# Patient Record
Sex: Female | Born: 1980 | Race: Black or African American | Hispanic: No | Marital: Single | Smoking: Current every day smoker
Health system: Southern US, Community
[De-identification: ages and names within clinical notes are randomized; demographics above are authoritative.]

## PROBLEM LIST (undated history)

## (undated) DIAGNOSIS — D649 Anemia, unspecified: Secondary | ICD-10-CM

---

## 1998-02-21 ENCOUNTER — Emergency Department (HOSPITAL_COMMUNITY): Admission: EM | Admit: 1998-02-21 | Discharge: 1998-02-21 | Payer: Self-pay | Admitting: Emergency Medicine

## 1998-03-02 ENCOUNTER — Encounter: Admission: RE | Admit: 1998-03-02 | Discharge: 1998-03-02 | Payer: Self-pay | Admitting: Family Medicine

## 1998-03-06 ENCOUNTER — Encounter: Admission: RE | Admit: 1998-03-06 | Discharge: 1998-03-06 | Payer: Self-pay | Admitting: Family Medicine

## 1998-03-13 ENCOUNTER — Encounter: Admission: RE | Admit: 1998-03-13 | Discharge: 1998-03-13 | Payer: Self-pay | Admitting: Family Medicine

## 1998-06-18 ENCOUNTER — Encounter: Admission: RE | Admit: 1998-06-18 | Discharge: 1998-06-18 | Payer: Self-pay | Admitting: Family Medicine

## 1998-08-29 ENCOUNTER — Encounter: Admission: RE | Admit: 1998-08-29 | Discharge: 1998-08-29 | Payer: Self-pay | Admitting: Family Medicine

## 1998-12-15 ENCOUNTER — Emergency Department (HOSPITAL_COMMUNITY): Admission: EM | Admit: 1998-12-15 | Discharge: 1998-12-15 | Payer: Self-pay | Admitting: Emergency Medicine

## 1999-02-07 ENCOUNTER — Encounter: Admission: RE | Admit: 1999-02-07 | Discharge: 1999-02-07 | Payer: Self-pay | Admitting: Family Medicine

## 2000-04-04 ENCOUNTER — Emergency Department (HOSPITAL_COMMUNITY): Admission: EM | Admit: 2000-04-04 | Discharge: 2000-04-05 | Payer: Self-pay | Admitting: Emergency Medicine

## 2000-07-28 ENCOUNTER — Encounter: Admission: RE | Admit: 2000-07-28 | Discharge: 2000-07-28 | Payer: Self-pay | Admitting: Family Medicine

## 2000-08-13 ENCOUNTER — Encounter: Admission: RE | Admit: 2000-08-13 | Discharge: 2000-08-13 | Payer: Self-pay | Admitting: Family Medicine

## 2000-10-14 ENCOUNTER — Encounter: Admission: RE | Admit: 2000-10-14 | Discharge: 2000-10-14 | Payer: Self-pay | Admitting: Family Medicine

## 2001-06-29 ENCOUNTER — Inpatient Hospital Stay (HOSPITAL_COMMUNITY): Admission: EM | Admit: 2001-06-29 | Discharge: 2001-06-30 | Payer: Self-pay | Admitting: Emergency Medicine

## 2001-06-29 ENCOUNTER — Encounter: Payer: Self-pay | Admitting: Emergency Medicine

## 2001-07-08 ENCOUNTER — Encounter: Admission: RE | Admit: 2001-07-08 | Discharge: 2001-07-08 | Payer: Self-pay | Admitting: Family Medicine

## 2001-08-11 ENCOUNTER — Encounter: Admission: RE | Admit: 2001-08-11 | Discharge: 2001-08-11 | Payer: Self-pay | Admitting: Family Medicine

## 2001-08-23 ENCOUNTER — Encounter: Admission: RE | Admit: 2001-08-23 | Discharge: 2001-08-23 | Payer: Self-pay | Admitting: Family Medicine

## 2001-08-23 ENCOUNTER — Other Ambulatory Visit: Admission: RE | Admit: 2001-08-23 | Discharge: 2001-08-23 | Payer: Self-pay | Admitting: Family Medicine

## 2001-08-25 ENCOUNTER — Encounter: Admission: RE | Admit: 2001-08-25 | Discharge: 2001-08-25 | Payer: Self-pay | Admitting: *Deleted

## 2001-09-06 ENCOUNTER — Encounter: Admission: RE | Admit: 2001-09-06 | Discharge: 2001-09-06 | Payer: Self-pay | Admitting: Family Medicine

## 2001-09-29 ENCOUNTER — Ambulatory Visit (HOSPITAL_COMMUNITY): Admission: RE | Admit: 2001-09-29 | Discharge: 2001-09-29 | Payer: Self-pay | Admitting: *Deleted

## 2002-01-28 ENCOUNTER — Encounter: Admission: RE | Admit: 2002-01-28 | Discharge: 2002-01-28 | Payer: Self-pay | Admitting: Family Medicine

## 2002-06-09 ENCOUNTER — Ambulatory Visit (HOSPITAL_COMMUNITY): Admission: RE | Admit: 2002-06-09 | Discharge: 2002-06-09 | Payer: Self-pay | Admitting: Family Medicine

## 2002-06-09 ENCOUNTER — Encounter: Payer: Self-pay | Admitting: Family Medicine

## 2002-06-09 ENCOUNTER — Encounter: Admission: RE | Admit: 2002-06-09 | Discharge: 2002-06-09 | Payer: Self-pay | Admitting: Family Medicine

## 2002-08-25 ENCOUNTER — Encounter: Admission: RE | Admit: 2002-08-25 | Discharge: 2002-08-25 | Payer: Self-pay | Admitting: Family Medicine

## 2002-09-09 ENCOUNTER — Encounter: Admission: RE | Admit: 2002-09-09 | Discharge: 2002-09-09 | Payer: Self-pay | Admitting: Family Medicine

## 2002-09-23 ENCOUNTER — Encounter: Admission: RE | Admit: 2002-09-23 | Discharge: 2002-09-23 | Payer: Self-pay | Admitting: Family Medicine

## 2002-09-23 ENCOUNTER — Other Ambulatory Visit: Admission: RE | Admit: 2002-09-23 | Discharge: 2002-09-23 | Payer: Self-pay | Admitting: Family Medicine

## 2003-09-27 ENCOUNTER — Encounter: Admission: RE | Admit: 2003-09-27 | Discharge: 2003-09-27 | Payer: Self-pay | Admitting: Family Medicine

## 2003-09-27 ENCOUNTER — Other Ambulatory Visit: Admission: RE | Admit: 2003-09-27 | Discharge: 2003-09-27 | Payer: Self-pay | Admitting: Family Medicine

## 2004-10-24 ENCOUNTER — Encounter (INDEPENDENT_AMBULATORY_CARE_PROVIDER_SITE_OTHER): Payer: Self-pay | Admitting: *Deleted

## 2004-10-24 LAB — CONVERTED CEMR LAB

## 2004-11-20 ENCOUNTER — Ambulatory Visit: Payer: Self-pay | Admitting: Family Medicine

## 2006-05-20 ENCOUNTER — Emergency Department (HOSPITAL_COMMUNITY): Admission: EM | Admit: 2006-05-20 | Discharge: 2006-05-20 | Payer: Self-pay | Admitting: Family Medicine

## 2006-07-23 DIAGNOSIS — J309 Allergic rhinitis, unspecified: Secondary | ICD-10-CM | POA: Insufficient documentation

## 2006-07-23 DIAGNOSIS — F17200 Nicotine dependence, unspecified, uncomplicated: Secondary | ICD-10-CM | POA: Insufficient documentation

## 2006-07-24 ENCOUNTER — Encounter (INDEPENDENT_AMBULATORY_CARE_PROVIDER_SITE_OTHER): Payer: Self-pay | Admitting: *Deleted

## 2006-10-09 ENCOUNTER — Telehealth: Payer: Self-pay | Admitting: *Deleted

## 2006-10-12 ENCOUNTER — Ambulatory Visit: Payer: Self-pay | Admitting: Family Medicine

## 2006-10-12 DIAGNOSIS — R51 Headache: Secondary | ICD-10-CM | POA: Insufficient documentation

## 2006-10-12 DIAGNOSIS — R519 Headache, unspecified: Secondary | ICD-10-CM | POA: Insufficient documentation

## 2007-07-16 ENCOUNTER — Encounter: Payer: Self-pay | Admitting: Family Medicine

## 2007-07-16 ENCOUNTER — Ambulatory Visit: Payer: Self-pay | Admitting: Family Medicine

## 2007-07-19 LAB — CONVERTED CEMR LAB: Pap Smear: NORMAL

## 2007-07-22 ENCOUNTER — Encounter: Payer: Self-pay | Admitting: Family Medicine

## 2007-11-17 ENCOUNTER — Emergency Department (HOSPITAL_COMMUNITY): Admission: EM | Admit: 2007-11-17 | Discharge: 2007-11-17 | Payer: Self-pay | Admitting: Emergency Medicine

## 2007-12-08 ENCOUNTER — Emergency Department (HOSPITAL_COMMUNITY): Admission: EM | Admit: 2007-12-08 | Discharge: 2007-12-09 | Payer: Self-pay | Admitting: Emergency Medicine

## 2008-11-13 ENCOUNTER — Emergency Department (HOSPITAL_COMMUNITY): Admission: EM | Admit: 2008-11-13 | Discharge: 2008-11-13 | Payer: Self-pay | Admitting: Emergency Medicine

## 2008-11-14 ENCOUNTER — Telehealth: Payer: Self-pay | Admitting: *Deleted

## 2008-11-16 ENCOUNTER — Ambulatory Visit: Payer: Self-pay | Admitting: Family Medicine

## 2008-11-16 DIAGNOSIS — R55 Syncope and collapse: Secondary | ICD-10-CM

## 2008-11-16 DIAGNOSIS — L299 Pruritus, unspecified: Secondary | ICD-10-CM | POA: Insufficient documentation

## 2008-11-20 ENCOUNTER — Encounter: Payer: Self-pay | Admitting: Family Medicine

## 2010-09-02 LAB — DIFFERENTIAL
Basophils Absolute: 0.1 10*3/uL (ref 0.0–0.1)
Eosinophils Absolute: 0.1 10*3/uL (ref 0.0–0.7)
Eosinophils Relative: 1 % (ref 0–5)

## 2010-09-02 LAB — BASIC METABOLIC PANEL
BUN: 14 mg/dL (ref 6–23)
Chloride: 105 mEq/L (ref 96–112)
GFR calc non Af Amer: >60 mL/min (ref >60)
Glucose, Bld: 83 mg/dL (ref 70–99)
Potassium: 4 mEq/L (ref 3.5–5.1)

## 2010-09-02 LAB — CBC
HCT: 35.3 % — ABNORMAL LOW (ref 36.0–46.0)
MCV: 99.2 fL (ref 78.0–100.0)
Platelets: 168 10*3/uL (ref 150–400)
RDW: 12.8 % (ref 11.5–15.5)

## 2010-10-11 NOTE — H&P (Signed)
[place]. [facility]  Patient:    Alejandra Garza, Alejandra Garza Visit Number: [id number] MRN: [id number]          Service Type: Dictated by:   Mont Dutton, M.D. Adm. Date:  06/29/01                           History and Physical  CHIEF COMPLAINT:  Headache.  HISTORY OF PRESENT ILLNESS:  A 30 year old female with a two-day history of headache.  Worked up at approximately 2 a.m. on Sunday morning two days prior to admission with a bilateral frontal and bilateral posterior headache.  She also had headache at the top of the head.  The patient has had no prior cold symptoms.  She did have photophobia, but no phonophobia.  No nausea, vomiting, or diarrhea.  No rhinorrhea.  No syncope.  The patient did not have a history of headache or migraine.  The pain was 9/10.  Currently after Vicodin, it is 3/10.  She does have some Excedrin Migraine which did not help at home.  No p.o. intake for the past two days because of her headache.  No recent fever. She did have some night sweats.  No nausea, vomiting, or diarrhea.  No myalgias.  No rhinorrhea.  No blurred vision.  No dysuria.  No polyuria. History of headache at age 54 or 73, otherwise no neurological history.  No shortness of breath.  No wheezes.  No rashes.  AO x 3.  PROBLEM LIST: 1. Herpes zoster. 2. PID in the past. 3. Anemia.  She states that she has been anemic since having her baby.  PAST MEDICAL HISTORY: 1. History of chlamydia, treated. 2. Condyloma acuminata, treated. 3. Multiple cervicitis. 4. History of CIN x 1.  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  High-risk sexual behavior.  No alcohol or drugs.  Smokes one pack per day.  She has an 19-year-old son.  MEDICATIONS:  Valtrex 500 mg b.i.d. p.r.n. rash.  ALLERGIES:  No known drug allergies.  PROCEDURES:  HIV negative in March of 2002.  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  PHYSICAL EXAMINATION:   Vital signs reviewed.  GENERAL APPEARANCE:  Alert and oriented.  Appropriate response.  PSYCHIATRIC:  No photophobia.  Judgment and insight intact.  Mental status appropriate.  HEENT:  Conjunctivae and lids normal.  Pupils and irises normal.  External ears, nose, teeth, and oropharynx were normal without mark.  NECK:  No thyromegaly.  No masses.  No lymphadenopathy.  LUNGS:  Clear to auscultation.  CARDIOVASCULAR:  Regular rate and rhythm.  GASTROINTESTINAL:  No masses or tenderness.  No hepatosplenomegaly.  NEUROLOGIC:  Cranial nerves II-XII were intact.  The neurological exam was nonfocal.  LABORATORIES AND TESTS:  White blood cell count 9.9, hemoglobin 13.6, hematocrit 39.2, platelets 169.  On ISTAT, sodium 139, potassium 3.5, chloride 107, bicarbonate 23, BUN 10, creatinine 0.9, glucose 86.  The pH was 7.43.  The mono screen was negative.  CSF obtained by lumbar puncture showed fluid colorless, red blood cells 60s-80s, white blood cells 305, 2% neutrophils, 93% lymphs, 4% monocytes, glucose 48, protein 65, Grams stain predominantly mononuclear, and no organisms.  On tube 4, red blood cells were 12, white blood cells 183, and 76% lymphs.  Culture pending at the time of the history and physical.  ASSESSMENT AND PLAN: 1. Headache.  Cerebrospinal fluid compatible with viral etiology  with normal    glucose, elevated protein, but less than 150, and no organisms.  There were    predominantly mononuclear cells on Grams stain.  At present, the patient    is much improved.  Symptomatic care.  Follow clinically.  Twenty-three hour    observation. 2. Dehydration.  Given poor p.o. intake, will rehydrate with IV fluids and    encourage p.o. intake.  Needs to tolerate p.o. prior to discharge.  Expect    23-hour observation after hydration and supportive care. Dictated by:   Mont Dutton, M.D. DD:  08/30/01 TD:  08/30/01 Job: [id number]

## 2010-10-11 NOTE — H&P (Signed)
[place]. [facility]  Patient:    Alejandra Garza, Alejandra Garza Visit Number: [id number] MRN: [id number]          Service Type: MED Location: 3000 [id number] Attending Physician:  McDiarmid, Leighton Roach. Dictated by:   Mont Dutton, M.D. Admit Date:  06/29/2001                           History and Physical  INCOMPLETE  SERVICE:  [facility].  PRIMARY MEDICAL PHYSICIAN:  Dr. Nolon Nations at the [facility].  CHIEF COMPLAINT:  Headache.  HISTORY OF PRESENT ILLNESS:  This is a 30 year old female with a two-day history of headache, woke up approximately 2 oclock a.m. Sunday morning, which is two days ago, with a bilateral frontal and bilateral posterior headache, also with headache at top of head.  Patient had no prior cold symptoms.  Did have photophobia but no phonophobia.  No nausea, vomiting or diaphoresis.  No rhinorrhea.  No syncope.  Patient did not have a history of headache or migraine. Dictated by:   Mont Dutton, M.D. Attending Physician:  McDiarmid, Tawanna Cooler D. DD:  06/29/01 TD:  06/30/01 Job: [id number]

## 2010-10-11 NOTE — Discharge Summary (Signed)
[place]. [facility]  Patient:    Alejandra Garza, Alejandra Garza Visit Number: [id number] MRN: [id number]          Service Type: MED Location: 3000 [id number] Attending Physician:  McDiarmid, Leighton Roach. Dictated by:   Nolon Nations, M.D. Admit Date:  06/29/2001 Discharge Date: 06/30/2001                             Discharge Summary  DATE OF BIRTH:  02-28-1981.  ADMISSION DIAGNOSES: 1. Headache. 2. Dehydration. 3. History of herpes zoster. 4. History of anemia. 5. History of pelvic inflammatory disease.  DISCHARGE DIAGNOSES: 1. Viral meningitis. 2. History of herpes zoster. 3. History of pelvic inflammatory disease. 4. History of anemia.  CONSULTS:  None.  PROCEDURES:  Lumbar puncture.  ADMISSION HISTORY:  [name] is a 30 year old patient of Nolon Nations, M.D., who presented with a two-day history of headache, photophobia.  Noted headache Sunday morning, two days prior to admission, frontal back of head as well as top.  No nausea, vomiting or diaphoresis.  No previous cold symptoms. Pain 9/10 and 3/10 after Vicodin.  HOSPITAL COURSE:  The patient was admitted and LP was performed.  White count was 9.9 on admission.  Electrolytes were within normal limits.  Spinal fluid revealed 305 wbc, 680 rbc, 93% lymphocytes, glucose CSF was 48 and protein was elevated at 65.  Mono screen was negative.  Preliminary culture at one day showed no organisms, predominantly mononuclear white blood cells, Gram stain showed no organisms and was final with mononuclear wbc present. The patient was given Vicodin for pain control and had good pain control.  He was tolerating p.o.s well in the morning.  He denied any headache on evaluation in the afternoon, was ready to go home.  DISCHARGE CONDITION:  Good.  DISCHARGE MEDICATIONS: 1. Vicodin one tablet p.o. q.4-6h. p.r.n. #5 given. 2. Valtrex take p.r.n. as before.  DISPOSITION:  Discharged to home.  DISCHARGE  FOLLOW-UP:  The patient was scheduled for an appointment with Dr. Rennis Harding at [facility] Thursday, July 08, 2000, at 9:25 a.m. ictated by:   Nolon Nations, M.D. Attending Physician:  McDiarmid, Tawanna Cooler D. DD:  06/30/01 TD:  07/01/01 Job: [id number]

## 2011-02-21 LAB — URINE MICROSCOPIC-ADD ON

## 2011-02-21 LAB — URINALYSIS, ROUTINE W REFLEX MICROSCOPIC
Bilirubin Urine: NEGATIVE
Glucose, UA: NEGATIVE
Hgb urine dipstick: NEGATIVE
Specific Gravity, Urine: 1.022
Urobilinogen, UA: 1

## 2011-02-21 LAB — POCT PREGNANCY, URINE
Operator id: 24446
Preg Test, Ur: NEGATIVE

## 2011-02-21 LAB — DIFFERENTIAL
Basophils Absolute: 0.1
Eosinophils Relative: 3
Lymphocytes Relative: 32
Monocytes Absolute: 0.6

## 2011-02-21 LAB — COMPREHENSIVE METABOLIC PANEL
AST: 24
Albumin: 3.7
Alkaline Phosphatase: 43
Chloride: 100
Creatinine, Ser: 0.71
GFR calc Af Amer: >60
Potassium: 3.4 — ABNORMAL LOW
Total Bilirubin: 0.9

## 2011-02-21 LAB — CBC
Platelets: 173
WBC: 7.8

## 2012-02-08 ENCOUNTER — Emergency Department (HOSPITAL_COMMUNITY)
Admission: EM | Admit: 2012-02-08 | Discharge: 2012-02-08 | Disposition: A | Discharge status: Home / Self Care | Payer: Self-pay | Attending: Emergency Medicine | Admitting: Emergency Medicine

## 2012-02-08 ENCOUNTER — Encounter (HOSPITAL_COMMUNITY): Payer: Self-pay | Admitting: *Deleted

## 2012-02-08 DIAGNOSIS — T7840XA Allergy, unspecified, initial encounter: Secondary | ICD-10-CM | POA: Insufficient documentation

## 2012-02-08 DIAGNOSIS — X58XXXA Exposure to other specified factors, initial encounter: Secondary | ICD-10-CM | POA: Insufficient documentation

## 2012-02-08 DIAGNOSIS — F17200 Nicotine dependence, unspecified, uncomplicated: Secondary | ICD-10-CM | POA: Insufficient documentation

## 2012-02-08 MED ORDER — DIPHENHYDRAMINE HCL 25 MG PO TABS: 25.0000 mg | ORAL_TABLET | Freq: Four times a day (QID) | ORAL | Status: DC

## 2012-02-08 MED ORDER — FAMOTIDINE 20 MG PO TABS: 20.0000 mg | ORAL_TABLET | Freq: Two times a day (BID) | ORAL | Status: DC

## 2012-02-08 MED ORDER — PREDNISONE 10 MG PO TABS: 50.0000 mg | ORAL_TABLET | Freq: Every day | ORAL | Status: DC

## 2012-02-08 NOTE — ED Notes (Addendum)
Pt reports swelling to face and neck. Pt reports itching initially to right jaw and then itching and swelling followed to face.  Pt denies history of similar reactions.  Pt denies shortness of breath or issues with swallowing.  Pt denies pain.  Pt denies new foods, make-up or detergent, but reports she used chemicals to treat her house for fleas on Thursday.  Pt denies rash. Pt put allegra allergy cream on face last night and states symptoms worsened this AM.

## 2012-02-08 NOTE — ED Provider Notes (Signed)
Medical screening examination/treatment/procedure(s) were performed by non-physician practitioner and as supervising physician I was immediately available for consultation/collaboration.    Dione Booze, MD 02/08/12 1058

## 2012-02-08 NOTE — ED Provider Notes (Signed)
History     CSN: [id number]  Arrival date & time 02/08/12  7829   First MD Initiated Contact with Patient 02/08/12 [phone]      Chief Complaint  Patient presents with  . Allergic Reaction  . Facial Swelling  . Pruritis    (Consider location/radiation/quality/duration/timing/severity/associated sxs/prior treatment) HPI Hx from pt. Alejandra Garza is a 31 y.o. female presenting with c/o possible allergic reaction. States that yesterday morning she awoke with itching and small bumps to the R side of her jaw which was followed by swelling to that side of her face. It progressed throughout the day yesterday and seemed to spread to her R neck and L face this am. She did use Allegra allergy cream without sig relief. Denies new soaps, detergents, makeup, hair products. Denies tooth pain, hx dental problems. Denies difficulty breathing, lip, tongue, throat swelling. Denies generalized rash. No hx same.  History reviewed. No pertinent past medical history.  History reviewed. No pertinent past surgical history.  No family history on file.  History  Substance Use Topics  . Smoking status: Current Every Day Smoker  . Smokeless tobacco: Not on file  . Alcohol Use: Yes    OB History    Grav Para Term Preterm Abortions TAB SAB Ect Mult Living                  Review of Systems as per HPI  Allergies  Review of patient's allergies indicates no known allergies.  Home Medications   Current Outpatient Rx  Name Route Sig Dispense Refill  . ACETAMINOPHEN 500 MG PO TABS Oral Take 1,000 mg by mouth every 6 (six) hours as needed. Pain    . DIPHENHYDRAMINE HCL 25 MG PO TABS Oral Take 1 tablet (25 mg total) by mouth every 6 (six) hours. 20 tablet 0  . FAMOTIDINE 20 MG PO TABS Oral Take 1 tablet (20 mg total) by mouth 2 (two) times daily. 30 tablet 0  . PREDNISONE 10 MG PO TABS Oral Take 5 tablets (50 mg total) by mouth daily. Take for 3 days. 15 tablet 0    BP 122/74  Pulse 63  Temp 98.1 F  (36.7 C) (Oral)  Resp 18  SpO2 100%  LMP 02/01/2012  Physical Exam  Nursing note and vitals reviewed. Constitutional: She appears well-developed and well-nourished. No distress.  HENT:  Head: Normocephalic and atraumatic.       Subtle edema to face with R > L. No rash noted. No obvious involvement of neck, no stridor.Teeth in good repair with no ttp. No trismus. No angioedema of lips, oropharynx.  Eyes: EOM are normal. Pupils are equal, round, and reactive to light.  Neck: Normal range of motion. Neck supple.  Cardiovascular: Normal rate, regular rhythm and normal heart sounds.   Pulmonary/Chest: Effort normal and breath sounds normal. She has no wheezes.  Abdominal: Soft. There is no tenderness.  Musculoskeletal: Normal range of motion.  Lymphadenopathy:    She has no cervical adenopathy.  Neurological: She is alert.  Skin: Skin is warm and dry. No rash noted. She is not diaphoretic.  Psychiatric: She has a normal mood and affect.    ED Course  Procedures (including critical care time)  Labs Reviewed - No data to display No results found.   1. Allergic reaction       MDM  Pt presents with swelling and itching to face which started yesterday and has progressed. No known new exposures. Likely nonspecific mild allergic  reaction. No evidence of angioedema/airway involvement. Pt instructed to take Benadryl and Pepcid x 3 days. She was given a rx for prednisone but was instructed to hold this and not start unless her sx do not improve with Benadryl/Pepcid. S/sx of angioedema discussed and she is aware that she is to return immediately should she note any of these. She verbalized understanding, was agreeable with plan.        Grant Fontana, PA-C 02/08/12 1028

## 2013-10-09 ENCOUNTER — Encounter (HOSPITAL_COMMUNITY): Payer: Self-pay | Admitting: Emergency Medicine

## 2013-10-09 ENCOUNTER — Emergency Department (HOSPITAL_COMMUNITY)
Admission: EM | Admit: 2013-10-09 | Discharge: 2013-10-09 | Disposition: A | Discharge status: Home / Self Care | Payer: Self-pay | Attending: Emergency Medicine | Admitting: Emergency Medicine

## 2013-10-09 DIAGNOSIS — Z3202 Encounter for pregnancy test, result negative: Secondary | ICD-10-CM | POA: Insufficient documentation

## 2013-10-09 DIAGNOSIS — Z8619 Personal history of other infectious and parasitic diseases: Secondary | ICD-10-CM | POA: Insufficient documentation

## 2013-10-09 DIAGNOSIS — N730 Acute parametritis and pelvic cellulitis: Secondary | ICD-10-CM | POA: Insufficient documentation

## 2013-10-09 DIAGNOSIS — Z79899 Other long term (current) drug therapy: Secondary | ICD-10-CM | POA: Insufficient documentation

## 2013-10-09 DIAGNOSIS — IMO0002 Reserved for concepts with insufficient information to code with codable children: Secondary | ICD-10-CM | POA: Insufficient documentation

## 2013-10-09 DIAGNOSIS — F17200 Nicotine dependence, unspecified, uncomplicated: Secondary | ICD-10-CM | POA: Insufficient documentation

## 2013-10-09 LAB — URINALYSIS, ROUTINE W REFLEX MICROSCOPIC
Bilirubin Urine: NEGATIVE
Glucose, UA: NEGATIVE mg/dL
Ketones, ur: NEGATIVE mg/dL
Nitrite: NEGATIVE
Protein, ur: 30 mg/dL — AB
Specific Gravity, Urine: 1.023 (ref 1.005–1.030)
Urobilinogen, UA: 1 mg/dL (ref 0.0–1.0)
pH: 8 (ref 5.0–8.0)

## 2013-10-09 LAB — WET PREP, GENITAL
TRICH WET PREP: NONE SEEN
Yeast Wet Prep HPF POC: NONE SEEN

## 2013-10-09 LAB — URINE MICROSCOPIC-ADD ON

## 2013-10-09 LAB — POC URINE PREG, ED: Preg Test, Ur: NEGATIVE

## 2013-10-09 MED ORDER — DOXYCYCLINE HYCLATE 100 MG PO CAPS: 100.0000 mg | ORAL_CAPSULE | Freq: Two times a day (BID) | ORAL | Status: AC

## 2013-10-09 MED ORDER — CEFTRIAXONE SODIUM 250 MG IJ SOLR
250.0000 mg | Freq: Once | INTRAMUSCULAR | Status: AC
  Administered 2013-10-09: 250 mg via INTRAMUSCULAR
  Filled 2013-10-09: qty 250

## 2013-10-09 MED ORDER — AZITHROMYCIN 250 MG PO TABS
1000.0000 mg | ORAL_TABLET | Freq: Once | ORAL | Status: AC
  Administered 2013-10-09: 1000 mg via ORAL
  Filled 2013-10-09: qty 4

## 2013-10-09 MED ORDER — IBUPROFEN 800 MG PO TABS: 800.0000 mg | ORAL_TABLET | Freq: Three times a day (TID) | ORAL | Status: DC

## 2013-10-09 NOTE — Discharge Instructions (Signed)
Pelvic Inflammatory Disease  Pelvic inflammatory disease (PID) refers to an infection in some or all of the female organs. The infection can be in the uterus, ovaries, fallopian tubes, or the surrounding tissues in the pelvis. PID can cause abdominal or pelvic pain that comes on suddenly (acute pelvic pain). PID is a serious infection because it can lead to lasting (chronic) pelvic pain or the inability to have children (infertile).   CAUSES   The infection is often caused by the normal bacteria found in the vaginal tissues. PID may also be caused by an infection that is spread during sexual contact. PID can also occur following:   · The birth of a baby.    · A miscarriage.    · An abortion.    · Major pelvic surgery.    · The use of an intrauterine device (IUD).    · A sexual assault.    RISK FACTORS  Certain factors can put a person at higher risk for PID, such as:  · Being younger than 25 years.  · Being sexually active at a young age.  · Using nonbarrier contraception.  · Having multiple sexual partners.  · Having sex with someone who has symptoms of a genital infection.  · Using oral contraception.  Other times, certain behaviors can increase the possibility of getting PID, such as:  · Having sex during your period.  · Using a vaginal douche.  · Having an intrauterine device (IUD) in place.  SYMPTOMS   · Abdominal or pelvic pain.    · Fever.    · Chills.    · Abnormal vaginal discharge.  · Abnormal uterine bleeding.    · Unusual pain shortly after finishing your period.  DIAGNOSIS   Your caregiver will choose some of the following methods to make a diagnosis, such as:   · Performing a physical exam and history. A pelvic exam typically reveals a very tender uterus and surrounding pelvis.    · Ordering laboratory tests including a pregnancy test, blood tests, and urine test.   · Ordering cultures of the vagina and cervix to check for a sexually transmitted infection (STI).  · Performing an ultrasound.     · Performing a laparoscopic procedure to look inside the pelvis.    TREATMENT   · Antibiotic medicines may be prescribed and taken by mouth.    · Sexual partners may be treated when the infection is caused by a sexually transmitted disease (STD).    · Hospitalization may be needed to give antibiotics intravenously.  · Surgery may be needed, but this is rare.  It may take weeks until you are completely well. If you are diagnosed with PID, you should also be checked for human immunodeficiency virus (HIV).    HOME CARE INSTRUCTIONS   · If given, take your antibiotics as directed. Finish the medicine even if you start to feel better.    · Only take over-the-counter or prescription medicines for pain, discomfort, or fever as directed by your caregiver.    · Do not have sexual intercourse until treatment is completed or as directed by your caregiver. If PID is confirmed, your recent sexual partner(s) will need treatment.    · Keep your follow-up appointments.  SEEK MEDICAL CARE IF:   · You have increased or abnormal vaginal discharge.    · You need prescription medicine for your pain.    · You vomit.    · You cannot take your medicines.    · Your partner has an STD.    SEEK IMMEDIATE MEDICAL CARE IF:   · You have a fever.    · You have increased abdominal or   pelvic pain.    · You have chills.    · You have pain when you urinate.    · You are not better after 72 hours following treatment.    MAKE SURE YOU:   · Understand these instructions.  · Will watch your condition.  · Will get help right away if you are not doing well or get worse.  Document Released: 05/12/2005 Document Revised: 09/06/2012 Document Reviewed: 05/08/2011  ExitCare® Patient Information ©2014 [employer].

## 2013-10-09 NOTE — ED Provider Notes (Signed)
CSN: [id number]     Arrival date & time 10/09/13  1634 History   First MD Initiated Contact with Patient 10/09/13 1641     No chief complaint on file.    (Consider location/radiation/quality/duration/timing/severity/associated sxs/prior Treatment) HPI  33 year old female with no significant past medical history presents complaining of abdominal pain and back pain. Patient reports for the past 2 weeks she has had intermittent low abnormal pain. She described pain as an achy sensation radiates to her back, usually worsen at night and in the morning and improves during the day. Taking ibuprofen helps with the pain. Her pain sometimes wakes her up at night. Pain is getting progressively worse. Initially she thought was her menstrual period which she did have menstrual period 2 weeks ago. When the menstrual period resolve she continues to have the same pain. She also reports occasional vaginal spotting after sexual activities but denies any pain with sexual activities. She has had 2 sexual partners within the past 6 months, states she use protection every single time. She also admits to having a significant history of STDs including gonorrhea chlamydia trichomonas but states that this pain felt different. Otherwise she denies fever, chills, nausea vomiting diarrhea, chest pain, shortness of breath, dysuria, hematuria, vaginal discharge, or rash. No prior abdominal surgery. No change in appetite.  No past medical history on file. No past surgical history on file. No family history on file. History  Substance Use Topics  . Smoking status: Current Every Day Smoker  . Smokeless tobacco: Not on file  . Alcohol Use: Yes   OB History   Grav Para Term Preterm Abortions TAB SAB Ect Mult Living                 Review of Systems  All other systems reviewed and are negative.     Allergies  Review of patient's allergies indicates no known allergies.  Home Medications   Prior to Admission  medications   Medication Sig Start Date End Date Taking? Authorizing Provider  acetaminophen (TYLENOL) 500 MG tablet Take 1,000 mg by mouth every 6 (six) hours as needed. Pain    Historical Provider, MD  diphenhydrAMINE (BENADRYL) 25 MG tablet Take 1 tablet (25 mg total) by mouth every 6 (six) hours. 02/08/12 03/09/12  Grant Fontanaatherine Williams, PA-C  famotidine (PEPCID) 20 MG tablet Take 1 tablet (20 mg total) by mouth 2 (two) times daily. 02/08/12 02/07/13  Grant Fontanaatherine Williams, PA-C  predniSONE (DELTASONE) 10 MG tablet Take 5 tablets (50 mg total) by mouth daily. Take for 3 days. 02/08/12   Grant Fontanaatherine Williams, PA-C   There were no vitals taken for this visit. Physical Exam  Nursing note and vitals reviewed. Constitutional: She is oriented to person, place, and time. She appears well-developed and well-nourished. No distress.  HENT:  Head: Normocephalic and atraumatic.  Eyes: Conjunctivae are normal.  Neck: Normal range of motion. Neck supple.  Cardiovascular: Normal rate and regular rhythm.   Pulmonary/Chest: Effort normal and breath sounds normal. She exhibits no tenderness.  Abdominal: Soft. There is tenderness (suprapubic tenderness without guarding or rebound tenderness.). There is no rebound.  Genitourinary: Uterus normal. There is no rash or lesion on the right labia. There is no rash or lesion on the left labia. Cervix exhibits motion tenderness and discharge. Right adnexum displays no mass and no tenderness. Left adnexum displays no mass and no tenderness. No erythema, tenderness or bleeding around the vagina. Vaginal discharge found.  Chaperone present:  Lymphadenopathy:  Right: No inguinal adenopathy present.       Left: No inguinal adenopathy present.  Neurological: She is alert and oriented to person, place, and time.    ED Course  Procedures (including critical care time)  5:07 PM Pt here with progressive lower abd pain and back pain.  She has reproducible suprapubic abd  tenderness but no CVA tenderness.  No peritoneal sign.   5:34 PM Patient with positive cervical motion tenderness concern for PID on exam. She also has moderate vaginal discharge as well. Given her prior history of STD, is currently sexually active, and having signs of PID, plan to treat patient with Rocephin and Zithromax here and 2 weeks course of doxycycline to go home.  UA with signs of UTI but likely contaminant from her PID since pt has not endorse UTI sxs.  Doxycycline should take care of her UTI if it is indeed UTI related.    Labs Review Labs Reviewed  WET PREP, GENITAL - Abnormal; Notable for the following:    Clue Cells Wet Prep HPF POC FEW (*)    WBC, Wet Prep HPF POC MANY (*)    All other components within normal limits  URINALYSIS, ROUTINE W REFLEX MICROSCOPIC - Abnormal; Notable for the following:    APPearance CLOUDY (*)    Hgb urine dipstick SMALL (*)    Protein, ur 30 (*)    Leukocytes, UA LARGE (*)    All other components within normal limits  URINE MICROSCOPIC-ADD ON - Abnormal; Notable for the following:    Squamous Epithelial / LPF FEW (*)    Bacteria, UA MANY (*)    All other components within normal limits  GC/CHLAMYDIA PROBE AMP  HIV ANTIBODY (ROUTINE TESTING)  POC URINE PREG, ED    Imaging Review No results found.   EKG Interpretation None      MDM   Final diagnoses:  PID (acute pelvic inflammatory disease)    BP 121/76  Pulse 108  Temp(Src) 99.6 F (37.6 C) (Oral)  Resp 14  SpO2 100%  LMP 09/25/2013  I have reviewed nursing notes and vital signs.  I reviewed available ER/hospitalization records thought the EMR     Fayrene HelperBowie Ezekeil Bethel, [place] JerseyPA-C 10/09/13 1839

## 2013-10-10 LAB — GC/CHLAMYDIA PROBE AMP
CT PROBE, AMP APTIMA: POSITIVE — AB
GC PROBE AMP APTIMA: POSITIVE — AB

## 2013-10-10 LAB — HIV ANTIBODY (ROUTINE TESTING W REFLEX): HIV 1&2 Ab, 4th Generation: NONREACTIVE

## 2013-10-10 NOTE — Progress Notes (Signed)
  CARE MANAGEMENT ED NOTE 10/10/2013  Patient:  Alejandra Garza,Alejandra Garza   Account Number:  [id number]  Date Initiated:  10/10/2013  Documentation initiated by:  Edd ArbourGIBBS,KIMBERLY  Subjective/Objective Assessment:   33 yr old self [place] [facility]/STD/PID Garza/c with Rx for     Subjective/Objective Assessment Detail:   [facility] [address] [facility] preferred one listed in EPIC as [address]  [facility]     Action/Plan:   CM received a call from pt Cm spoke with her about her concerns CM spoke with Dr Micheline Mazeocherty who was unable to find an alternative medication for ED dx Pt offered MATCH services   Action/Plan Detail:   see notes below   Anticipated DC Date:  10/09/2013     Status Recommendation to Physician:   Result of Recommendation:    Other ED Services  Consult Working Plan    DC Planning Services  Other  PCP issues  Outpatient Services - Pt will follow up  Medication Assistance  MATCH Program    Choice offered to / List presented to:            Status of service:  Completed, signed off  ED Comments:   ED Comments Detail:  CM reviewed EPIC notes and chart review information CM spoke with the pt about [facility] ($3 co pay for each Rx through [facility], does not include refills, 7 day expiration of MATCH letter and choice of pharmacies) Pt agreed to receive assistance from program CM spoke with pt who [place]. CM discussed and provided written information for self pay pcps, importance of pcp for f/u care, www.needymeds.org, discounted pharmacies and other [place] resources such as financial assistance, DSS and  health department  Reviewed resources for [place] pcps like [facility], family medicine at [address], [facility], general medical clinics, [facility] plus others, [facility] out [facility],  housing, and other resources [place]. Pt voiced understanding and appreciation of resources provided Pt is eligible for [facility] program (unable to find pt listed in PDMI per cardholder name inquiry) PDMI information entered. MATCH letter [facility] [address] fax [phone] Fax confirmation received at 1807 10/10/13 CM spoke with Tyrin about MATCH letter Pt updated on correct pharmacy, $4 and Rx needed to [facility] [address] within 7 days for one time service

## 2013-10-10 NOTE — Progress Notes (Signed)
                  Dear _________[name]__________________:  Alejandra QuinYou have been approved to have your discharge prescriptions filled through our [facility] ([facility]) program. This program allows for a one-time (no refills) 34-day supply of selected medications for a low copay amount.  The copay is $3.00 per prescription. For instance, if you have one prescription, you will pay $3.00; for two prescriptions, you pay $6.00; for three prescriptions, you pay $9.00; and so on.  Only certain pharmacies are participating in this program with [facility]. You will need to select one of the pharmacies from the attached list and take your prescriptions, this letter, and your photo ID to one of the participating pharmacies.   We are excited that you are able to use the [facility] program to get your medications. These prescriptions must be filled within 7 days of hospital discharge or they will no longer be valid for the [facility]. Should you have any problems with your prescriptions please contact your case management team member at [phone].  Thank you,   [place]

## 2013-10-10 NOTE — ED Provider Notes (Signed)
Medical screening examination/treatment/procedure(s) were conducted as a shared visit with non-physician practitioner(s) and myself.  I personally evaluated the patient during the encounter.   EKG Interpretation None       Juliet RudeNathan R. Rubin PayorPickering, MD 10/10/13 [phone]

## 2013-10-13 ENCOUNTER — Telehealth (HOSPITAL_BASED_OUTPATIENT_CLINIC_OR_DEPARTMENT_OTHER): Payer: Self-pay | Admitting: Emergency Medicine

## 2013-10-13 NOTE — Telephone Encounter (Signed)
+  Chlamydia. +Gonorrhea. Patient treated with Rocephin and Zithromax. [employer] faxed. 

## 2014-02-13 ENCOUNTER — Emergency Department (HOSPITAL_BASED_OUTPATIENT_CLINIC_OR_DEPARTMENT_OTHER)
Admission: EM | Admit: 2014-02-13 | Discharge: 2014-02-13 | Disposition: A | Discharge status: Home / Self Care | Payer: Self-pay | Attending: Emergency Medicine | Admitting: Emergency Medicine

## 2014-02-13 ENCOUNTER — Encounter (HOSPITAL_BASED_OUTPATIENT_CLINIC_OR_DEPARTMENT_OTHER): Payer: Self-pay | Admitting: Emergency Medicine

## 2014-02-13 DIAGNOSIS — R109 Unspecified abdominal pain: Secondary | ICD-10-CM | POA: Insufficient documentation

## 2014-02-13 DIAGNOSIS — Z3202 Encounter for pregnancy test, result negative: Secondary | ICD-10-CM | POA: Insufficient documentation

## 2014-02-13 DIAGNOSIS — R590 Localized enlarged lymph nodes: Secondary | ICD-10-CM

## 2014-02-13 DIAGNOSIS — F17200 Nicotine dependence, unspecified, uncomplicated: Secondary | ICD-10-CM | POA: Insufficient documentation

## 2014-02-13 DIAGNOSIS — R599 Enlarged lymph nodes, unspecified: Secondary | ICD-10-CM | POA: Insufficient documentation

## 2014-02-13 DIAGNOSIS — Z791 Long term (current) use of non-steroidal anti-inflammatories (NSAID): Secondary | ICD-10-CM | POA: Insufficient documentation

## 2014-02-13 DIAGNOSIS — N949 Unspecified condition associated with female genital organs and menstrual cycle: Secondary | ICD-10-CM | POA: Insufficient documentation

## 2014-02-13 LAB — URINALYSIS, ROUTINE W REFLEX MICROSCOPIC
Bilirubin Urine: NEGATIVE
Glucose, UA: NEGATIVE mg/dL
Hgb urine dipstick: NEGATIVE
KETONES UR: NEGATIVE mg/dL
Leukocytes, UA: NEGATIVE
NITRITE: NEGATIVE
Protein, ur: NEGATIVE mg/dL
SPECIFIC GRAVITY, URINE: 1.026 (ref 1.005–1.030)
UROBILINOGEN UA: 1 mg/dL (ref 0.0–1.0)
pH: 6 (ref 5.0–8.0)

## 2014-02-13 LAB — PREGNANCY, URINE: PREG TEST UR: NEGATIVE

## 2014-02-13 MED ORDER — CEPHALEXIN 500 MG PO CAPS: 500.0000 mg | ORAL_CAPSULE | Freq: Four times a day (QID) | ORAL | Status: DC

## 2014-02-13 NOTE — Discharge Instructions (Signed)
Keflex as prescribed.  Ibuprofen 400 mg every 6 hours as needed for pain.  Return to the emergency department if you develop high fever, redness or swelling, or other new and concerning symptoms.   Swollen Lymph Nodes The lymphatic system filters fluid from around cells. It is like a system of blood vessels. These channels carry lymph instead of blood. The lymphatic system is an important part of the immune (disease fighting) system. When people talk about "swollen glands in the neck," they are usually talking about swollen lymph nodes. The lymph nodes are like the little traps for infection. You and your caregiver may be able to feel lymph nodes, especially swollen nodes, in these common areas: the groin (inguinal area), armpits (axilla), and above the clavicle (supraclavicular). You may also feel them in the neck (cervical) and the back of the head just above the hairline (occipital). Swollen glands occur when there is any condition in which the body responds with an allergic type of reaction. For instance, the glands in the neck can become swollen from insect bites or any type of minor infection on the head. These are very noticeable in children with only minor problems. Lymph nodes may also become swollen when there is a tumor or problem with the lymphatic system, such as Hodgkin's disease. TREATMENT   Most swollen glands do not require treatment. They can be observed (watched) for a short period of time, if your caregiver feels it is necessary. Most of the time, observation is not necessary.  Antibiotics (medicines that kill germs) may be prescribed by your caregiver. Your caregiver may prescribe these if he or she feels the swollen glands are due to a bacterial (germ) infection. Antibiotics are not used if the swollen glands are caused by a virus. HOME CARE INSTRUCTIONS   Take medications as directed by your caregiver. Only take over-the-counter or prescription medicines for pain, discomfort,  or fever as directed by your caregiver. SEEK MEDICAL CARE IF:   If you begin to run a temperature greater than 102 F (38.9 C), or as your caregiver suggests. MAKE SURE YOU:   Understand these instructions.  Will watch your condition.  Will get help right away if you are not doing well or get worse. Document Released: 05/02/2002 Document Revised: 08/04/2011 Document Reviewed: 05/12/2005 [facility] Patient Information 2015 [place], [place]. This information is not intended to replace advice given to you by your health care provider. Make sure you discuss any questions you have with your health care provider.

## 2014-02-13 NOTE — ED Notes (Signed)
Right groin pain that started yesterday.

## 2014-02-13 NOTE — ED Provider Notes (Signed)
CSN: [id number]     Arrival date & time 02/13/14  1452 History  This chart was scribed for Geoffery Lyons, MD by Charline Bills, ED Scribe. The patient was seen in room MH05/MH05. Patient's care was started at 4:16 PM.   Chief Complaint  Patient presents with  . Groin Pain   Patient is a 33 y.o. female presenting with groin pain. The history is provided by the patient. No language interpreter was used.  Groin Pain This is a new problem. The current episode started more than 2 days ago. The problem occurs constantly. The problem has been gradually worsening. Pertinent negatives include no abdominal pain. The symptoms are aggravated by standing.   HPI Comments: Alejandra Garza is a 33 y.o. female who presents to the Emergency Department complaining of worsening R groin pain onset a few days ago. Pt initially noted while she was standing. She describes the pain as pressure and a throbbing sensation. Pt reports that pain worsened today with standing for long periods of time at work. Pt also states that she feels a "knot" in her R groin. She denies h/o similar pain. She also denies abdominal pain, dysuria, vaginal discharge, hematuria, or any other urinary symptoms. She also denies new wounds on bilateral legs. LNMP ended 02/09/14.  History reviewed. No pertinent past medical history. History reviewed. No pertinent past surgical history. No family history on file. History  Substance Use Topics  . Smoking status: Current Every Day Smoker -- 1.00 packs/day    Types: Cigarettes  . Smokeless tobacco: Not on file  . Alcohol Use: Yes     Comment: 1 drink daily   OB History   Grav Para Term Preterm Abortions TAB SAB Ect Mult Living                 Review of Systems  Gastrointestinal: Negative for abdominal pain.  Genitourinary: Positive for pelvic pain. Negative for dysuria, hematuria and vaginal discharge.  All other systems reviewed and are negative.  Allergies  Review of patient's allergies  indicates no known allergies.  Home Medications   Prior to Admission medications   Medication Sig Start Date End Date Taking? Authorizing Provider  ibuprofen (ADVIL,MOTRIN) 800 MG tablet Take 1 tablet (800 mg total) by mouth 3 (three) times daily. 10/09/13   Fayrene Helper, PA-C   Triage Vitals: BP 123/67  Pulse 86  Temp(Src) 99.5 F (37.5 C) (Oral)  Resp 16  Ht  (1.626 m)  Wt 107 lb (48.535 kg)  BMI 18.36 kg/m2  SpO2 100%  LMP 02/09/2014 Physical Exam  Nursing note and vitals reviewed. Constitutional: She is oriented to person, place, and time. She appears well-developed and well-nourished. No distress.  HENT:  Head: Normocephalic and atraumatic.  Eyes: Conjunctivae and EOM are normal.  Neck: Neck supple. No tracheal deviation present.  Cardiovascular: Normal rate.   Pulmonary/Chest: Effort normal. No respiratory distress.  Musculoskeletal: Normal range of motion.  Lymphadenopathy:  There is a small, 1 cm lump in the medial aspect of the upper R thigh/groin area. There is no redness or erythema. No palpable inguinal hernia.   Neurological: She is alert and oriented to person, place, and time.  Skin: Skin is warm and dry.  Psychiatric: She has a normal mood and affect. Her behavior is normal.   ED Course  Procedures (including critical care time) DIAGNOSTIC STUDIES: Oxygen Saturation is 100% on RA, normal by my interpretation.    COORDINATION OF CARE: 4:22 PM-Discussed treatment plan which  includes UA with pt at bedside and pt agreed to plan.   Labs Review Labs Reviewed  PREGNANCY, URINE  URINALYSIS, ROUTINE W REFLEX MICROSCOPIC   Imaging Review No results found.   EKG Interpretation None      MDM   Final diagnoses:  None    Patient presents with right groin pain that appears to be a swollen lymph node. We'll treat with Keflex for this. She is to followup if worsening or not improving.  I personally performed the services described in this documentation,  which was scribed in my presence. The recorded information has been reviewed and is accurate.    Geoffery Lyons, MD 02/14/14 1536

## 2014-09-26 ENCOUNTER — Emergency Department (HOSPITAL_COMMUNITY)
Admission: EM | Admit: 2014-09-26 | Discharge: 2014-09-26 | Disposition: A | Discharge status: Home / Self Care | Payer: Self-pay | Attending: Emergency Medicine | Admitting: Emergency Medicine

## 2014-09-26 ENCOUNTER — Encounter (HOSPITAL_COMMUNITY): Payer: Self-pay | Admitting: General Practice

## 2014-09-26 DIAGNOSIS — Z792 Long term (current) use of antibiotics: Secondary | ICD-10-CM | POA: Insufficient documentation

## 2014-09-26 DIAGNOSIS — M545 Low back pain, unspecified: Secondary | ICD-10-CM

## 2014-09-26 DIAGNOSIS — Z3202 Encounter for pregnancy test, result negative: Secondary | ICD-10-CM | POA: Insufficient documentation

## 2014-09-26 DIAGNOSIS — M542 Cervicalgia: Secondary | ICD-10-CM | POA: Insufficient documentation

## 2014-09-26 DIAGNOSIS — G44209 Tension-type headache, unspecified, not intractable: Secondary | ICD-10-CM | POA: Insufficient documentation

## 2014-09-26 DIAGNOSIS — M546 Pain in thoracic spine: Secondary | ICD-10-CM | POA: Insufficient documentation

## 2014-09-26 DIAGNOSIS — Z720 Tobacco use: Secondary | ICD-10-CM | POA: Insufficient documentation

## 2014-09-26 DIAGNOSIS — Z791 Long term (current) use of non-steroidal anti-inflammatories (NSAID): Secondary | ICD-10-CM | POA: Insufficient documentation

## 2014-09-26 LAB — COMPREHENSIVE METABOLIC PANEL
ALBUMIN: 3.7 g/dL (ref 3.5–5.0)
ALK PHOS: 37 U/L — AB (ref 38–126)
ALT: 16 U/L (ref 14–54)
AST: 21 U/L (ref 15–41)
Anion gap: 9 (ref 5–15)
BUN: 6 mg/dL (ref 6–20)
CO2: 23 mmol/L (ref 22–32)
Calcium: 9 mg/dL (ref 8.9–10.3)
Chloride: 104 mmol/L (ref 101–111)
Creatinine, Ser: 0.69 mg/dL (ref 0.44–1.00)
GFR calc Af Amer: >60 mL/min (ref >60)
GFR calc non Af Amer: >60 mL/min (ref >60)
GLUCOSE: 109 mg/dL — AB (ref 70–99)
POTASSIUM: 3.5 mmol/L (ref 3.5–5.1)
Sodium: 136 mmol/L (ref 135–145)
TOTAL PROTEIN: 6.3 g/dL — AB (ref 6.5–8.1)
Total Bilirubin: 0.7 mg/dL (ref 0.3–1.2)

## 2014-09-26 LAB — CBC WITH DIFFERENTIAL/PLATELET
BASOS ABS: 0 10*3/uL (ref 0.0–0.1)
Basophils Relative: 0 % (ref 0–1)
EOS PCT: 0 % (ref 0–5)
Eosinophils Absolute: 0 10*3/uL (ref 0.0–0.7)
HCT: 37.3 % (ref 36.0–46.0)
Hemoglobin: 12.6 g/dL (ref 12.0–15.0)
LYMPHS PCT: 16 % (ref 12–46)
Lymphs Abs: 1.8 10*3/uL (ref 0.7–4.0)
MCH: 32.4 pg (ref 26.0–34.0)
MCHC: 33.8 g/dL (ref 30.0–36.0)
MCV: 95.9 fL (ref 78.0–100.0)
Monocytes Absolute: 1 10*3/uL (ref 0.1–1.0)
Monocytes Relative: 9 % (ref 3–12)
Neutro Abs: 8.3 10*3/uL — ABNORMAL HIGH (ref 1.7–7.7)
Neutrophils Relative %: 75 % (ref 43–77)
PLATELETS: 244 10*3/uL (ref 150–400)
RBC: 3.89 MIL/uL (ref 3.87–5.11)
RDW: 13 % (ref 11.5–15.5)
WBC: 11.1 10*3/uL — AB (ref 4.0–10.5)

## 2014-09-26 LAB — URINALYSIS, ROUTINE W REFLEX MICROSCOPIC
BILIRUBIN URINE: NEGATIVE
Glucose, UA: NEGATIVE mg/dL
Ketones, ur: 15 mg/dL — AB
Nitrite: NEGATIVE
PH: 5.5 (ref 5.0–8.0)
Protein, ur: NEGATIVE mg/dL
SPECIFIC GRAVITY, URINE: 1.023 (ref 1.005–1.030)
Urobilinogen, UA: 1 mg/dL (ref 0.0–1.0)

## 2014-09-26 LAB — URINE MICROSCOPIC-ADD ON

## 2014-09-26 LAB — PREGNANCY, URINE: Preg Test, Ur: NEGATIVE

## 2014-09-26 MED ORDER — METOCLOPRAMIDE HCL 5 MG/ML IJ SOLN
10.0000 mg | Freq: Once | INTRAMUSCULAR | Status: AC
  Administered 2014-09-26: 10 mg via INTRAVENOUS
  Filled 2014-09-26: qty 2

## 2014-09-26 MED ORDER — HYDROCODONE-ACETAMINOPHEN 5-325 MG PO TABS
2.0000 | ORAL_TABLET | Freq: Once | ORAL | Status: AC
  Administered 2014-09-26: 2 via ORAL
  Filled 2014-09-26: qty 2

## 2014-09-26 MED ORDER — IBUPROFEN 800 MG PO TABS: 800.0000 mg | ORAL_TABLET | Freq: Three times a day (TID) | ORAL | Status: DC

## 2014-09-26 MED ORDER — METHOCARBAMOL 500 MG PO TABS: 500.0000 mg | ORAL_TABLET | Freq: Two times a day (BID) | ORAL | Status: DC

## 2014-09-26 MED ORDER — KETOROLAC TROMETHAMINE 30 MG/ML IJ SOLN
30.0000 mg | Freq: Once | INTRAMUSCULAR | Status: AC
  Administered 2014-09-26: 30 mg via INTRAVENOUS
  Filled 2014-09-26: qty 1

## 2014-09-26 MED ORDER — DIPHENHYDRAMINE HCL 50 MG/ML IJ SOLN
25.0000 mg | Freq: Once | INTRAMUSCULAR | Status: AC
  Administered 2014-09-26: 25 mg via INTRAVENOUS
  Filled 2014-09-26: qty 1

## 2014-09-26 NOTE — ED Provider Notes (Signed)
CSN: [id number]     Arrival date & time 09/26/14  [id number] History   First MD Initiated Contact with Patient 09/26/14 [phone]     Chief Complaint  Patient presents with  . Headache     (Consider location/radiation/quality/duration/timing/severity/associated sxs/prior Treatment) Patient is a 34 y.o. female presenting with back pain. The history is provided by the patient. No language interpreter was used.  Back Pain Location:  Generalized Quality:  Aching Radiates to:  Does not radiate Pain severity:  Severe Pain is:  Same all the time Onset quality:  Gradual Timing:  Constant Progression:  Worsening Chronicity:  New Context: not recent injury and not twisting   Relieved by:  Nothing Worsened by:  Nothing tried Ineffective treatments:  None tried Associated symptoms: leg pain and numbness   Risk factors: no hx of cancer, no lack of exercise, not pregnant and no vascular disease     History reviewed. No pertinent past medical history. History reviewed. No pertinent past surgical history. No family history on file. History  Substance Use Topics  . Smoking status: Current Every Day Smoker -- 1.00 packs/day    Types: Cigarettes  . Smokeless tobacco: Not on file  . Alcohol Use: 4.2 oz/week    7 Cans of beer per week     Comment: 1 drink daily   OB History    No data available     Review of Systems  Musculoskeletal: Positive for back pain.  Neurological: Positive for numbness.  All other systems reviewed and are negative.     Allergies  Review of patient's allergies indicates no known allergies.  Home Medications   Prior to Admission medications   Medication Sig Start Date End Date Taking? Authorizing Provider  cephALEXin (KEFLEX) 500 MG capsule Take 1 capsule (500 mg total) by mouth 4 (four) times daily. 02/13/14   Geoffery Lyonsouglas Delo, MD  ibuprofen (ADVIL,MOTRIN) 800 MG tablet Take 1 tablet (800 mg total) by mouth 3 (three) times daily. 10/09/13   Fayrene HelperBowie Tran, PA-C   BP 113/72  mmHg  Pulse 98  Temp(Src) 99.1 F (37.3 C) (Oral)  Resp 16  Ht 5\' 4"  (1.626 m)  Wt 110 lb (49.896 kg)  BMI 18.87 kg/m2  SpO2 100%  LMP 09/23/2014 Physical Exam  Constitutional: She is oriented to person, place, and time. She appears well-developed and well-nourished.  HENT:  Head: Normocephalic.  Right Ear: External ear normal.  Left Ear: External ear normal.  Nose: Nose normal.  Mouth/Throat: Oropharynx is clear and moist.  Eyes: Conjunctivae and EOM are normal. Pupils are equal, round, and reactive to light.  Neck: Normal range of motion.  Cardiovascular: Normal rate, regular rhythm and normal heart sounds.   Pulmonary/Chest: Effort normal.  Abdominal: Soft. She exhibits no distension.  Musculoskeletal: Normal range of motion.  Tender cervical, thoracic and lumbar spine tenderness,   Neurological: She is alert and oriented to person, place, and time.  Skin: Skin is warm.  Psychiatric: She has a normal mood and affect.  Nursing note and vitals reviewed.   ED Course  Procedures (including critical care time) Labs Review Labs Reviewed  URINALYSIS, ROUTINE W REFLEX MICROSCOPIC - Abnormal; Notable for the following:    Hgb urine dipstick MODERATE (*)    Ketones, ur 15 (*)    Leukocytes, UA SMALL (*)    All other components within normal limits  CBC WITH DIFFERENTIAL/PLATELET - Abnormal; Notable for the following:    WBC 11.1 (*)    Neutro Abs 8.3 (*)  All other components within normal limits  COMPREHENSIVE METABOLIC PANEL - Abnormal; Notable for the following:    Glucose, Bld 109 (*)    Total Protein 6.3 (*)    Alkaline Phosphatase 37 (*)    All other components within normal limits  URINE MICROSCOPIC-ADD ON - Abnormal; Notable for the following:    Squamous Epithelial / LPF FEW (*)    Bacteria, UA FEW (*)    All other components within normal limits  PREGNANCY, URINE    Imaging Review No results found.   EKG Interpretation None      MDM Pt reported  relief to Rn.   Dr. Shyrl Numbers in to see and examine pt.   Pt reports headache returning.   Pt given migraine cocktail.   I think symptoms are muscular.  Pt is afebrile in ED.  No menigeal signs.   No red flags.    Pt given rx for robaxin and ibuprofen.   Final diagnoses:  Tension-type headache, not intractable, unspecified chronicity pattern  Bilateral low back pain without sciatica        Elson Areas, PA-C 09/26/14 1605  Derwood Kaplan, MD 09/27/14 [phone]

## 2014-09-26 NOTE — ED Notes (Signed)
Spoke to Trisha MangleKaren Sophia, NP, orders received that pt. Can eat.  Lunch bag and sprite given to pt. And her family.

## 2014-09-26 NOTE — ED Notes (Signed)
Pt complaining of a headache that started yesterday. Pt reports pain is a 8/10. Headache is worse with standing, and movement. Pt also reports back pain. Pt denies any N/V/D.

## 2014-09-26 NOTE — Discharge Instructions (Signed)
Back Pain, Adult  Low back pain is very common. About 1 in 5 people have back pain. The cause of low back pain is rarely dangerous. The pain often gets better over time. About half of people with a sudden onset of back pain feel better in just 2 weeks. About 8 in 10 people feel better by 6 weeks.   CAUSES  Some common causes of back pain include:  · Strain of the muscles or ligaments supporting the spine.  · Wear and tear (degeneration) of the spinal discs.  · Arthritis.  · Direct injury to the back.  DIAGNOSIS  Most of the time, the direct cause of low back pain is not known. However, back pain can be treated effectively even when the exact cause of the pain is unknown. Answering your caregiver's questions about your overall health and symptoms is one of the most accurate ways to make sure the cause of your pain is not dangerous. If your caregiver needs more information, he or she may order lab work or imaging tests (X-rays or MRIs). However, even if imaging tests show changes in your back, this usually does not require surgery.  HOME CARE INSTRUCTIONS  For many people, back pain returns. Since low back pain is rarely dangerous, it is often a condition that people can learn to manage on their own.   · Remain active. It is stressful on the back to sit or stand in one place. Do not sit, drive, or stand in one place for more than 30 minutes at a time. Take short walks on level surfaces as soon as pain allows. Try to increase the length of time you walk each day.  · Do not stay in bed. Resting more than 1 or 2 days can delay your recovery.  · Do not avoid exercise or work. Your body is made to move. It is not dangerous to be active, even though your back may hurt. Your back will likely heal faster if you return to being active before your pain is gone.  · Pay attention to your body when you  bend and lift. Many people have less discomfort when lifting if they bend their knees, keep the load close to their bodies, and  avoid twisting. Often, the most comfortable positions are those that put less stress on your recovering back.  · Find a comfortable position to sleep. Use a firm mattress and lie on your side with your knees slightly bent. If you lie on your back, put a pillow under your knees.  · Only take over-the-counter or prescription medicines as directed by your caregiver. Over-the-counter medicines to reduce pain and inflammation are often the most helpful. Your caregiver may prescribe muscle relaxant drugs. These medicines help dull your pain so you can more quickly return to your normal activities and healthy exercise.  · Put ice on the injured area.  · Put ice in a plastic bag.  · Place a towel between your skin and the bag.  · Leave the ice on for 15-20 minutes, 03-04 times a day for the first 2 to 3 days. After that, ice and heat may be alternated to reduce pain and spasms.  · Ask your caregiver about trying back exercises and gentle massage. This may be of some benefit.  · Avoid feeling anxious or stressed. Stress increases muscle tension and can worsen back pain. It is important to recognize when you are anxious or stressed and learn ways to manage it. Exercise is a great option.  SEEK MEDICAL CARE IF:  · You have pain that is not relieved with rest or   medicine.  · You have pain that does not improve in 1 week.  · You have new symptoms.  · You are generally not feeling well.  SEEK IMMEDIATE MEDICAL CARE IF:   · You have pain that radiates from your back into your legs.  · You develop new bowel or bladder control problems.  · You have unusual weakness or numbness in your arms or legs.  · You develop nausea or vomiting.  · You develop abdominal pain.  · You feel faint.  Document Released: 05/12/2005 Document Revised: 11/11/2011 Document Reviewed: 09/13/2013  ExitCare® Patient Information ©2015 [employer]. This information is not intended to replace advice given to you by your health care provider. Make sure you  discuss any questions you have with your health care provider.  Headaches, Frequently Asked Questions  MIGRAINE HEADACHES  Q: What is migraine? What causes it? How can I treat it?  A: Generally, migraine headaches begin as a dull ache. Then they develop into a constant, throbbing, and pulsating pain. You may experience pain at the temples. You may experience pain at the front or back of one or both sides of the head. The pain is usually accompanied by a combination of:  · Nausea.  · Vomiting.  · Sensitivity to light and noise.  Some people (about 15%) experience an aura (see below) before an attack. The cause of migraine is believed to be chemical reactions in the brain. Treatment for migraine may include over-the-counter or prescription medications. It may also include self-help techniques. These include relaxation training and biofeedback.   Q: What is an aura?  A: About 15% of people with migraine get an "aura". This is a sign of neurological symptoms that occur before a migraine headache. You may see wavy or jagged lines, dots, or flashing lights. You might experience tunnel vision or blind spots in one or both eyes. The aura can include visual or auditory hallucinations (something imagined). It may include disruptions in smell (such as strange odors), taste or touch. Other symptoms include:  · Numbness.  · A "pins and needles" sensation.  · Difficulty in recalling or speaking the correct word.  These neurological events may last as long as 60 minutes. These symptoms will fade as the headache begins.  Q: What is a trigger?  A: Certain physical or environmental factors can lead to or "trigger" a migraine. These include:  · Foods.  · Hormonal changes.  · Weather.  · Stress.  It is important to remember that triggers are different for everyone. To help prevent migraine attacks, you need to figure out which triggers affect you. Keep a headache diary. This is a good way to track triggers. The diary will help you talk  to your healthcare professional about your condition.  Q: Does weather affect migraines?  A: Bright sunshine, hot, humid conditions, and drastic changes in barometric pressure may lead to, or "trigger," a migraine attack in some people. But studies have shown that weather does not act as a trigger for everyone with migraines.  Q: What is the link between migraine and hormones?  A: Hormones start and regulate many of your body's functions. Hormones keep your body in balance within a constantly changing environment. The levels of hormones in your body are unbalanced at times. Examples are during menstruation, pregnancy, or menopause. That can lead to a migraine attack. In fact, about three quarters of all women with migraine report that their attacks are related to the menstrual cycle.     Q: Is there an increased risk of stroke for migraine sufferers?  A: The likelihood of a migraine attack causing a stroke is very remote. That is not to say that migraine sufferers cannot have a stroke associated with their migraines. In persons under age 40, the most common associated factor for stroke is migraine headache. But over the course of a person's normal life span, the occurrence of migraine headache may actually be associated with a reduced risk of dying from cerebrovascular disease due to stroke.   Q: What are acute medications for migraine?  A: Acute medications are used to treat the pain of the headache after it has started. Examples over-the-counter medications, NSAIDs, ergots, and triptans.   Q: What are the triptans?  A: Triptans are the newest class of abortive medications. They are specifically targeted to treat migraine. Triptans are vasoconstrictors. They moderate some chemical reactions in the brain. The triptans work on receptors in your brain. Triptans help to restore the balance of a neurotransmitter called serotonin. Fluctuations in levels of serotonin are thought to be a main cause of migraine.   Q: Are  over-the-counter medications for migraine effective?  A: Over-the-counter, or "OTC," medications may be effective in relieving mild to moderate pain and associated symptoms of migraine. But you should see your caregiver before beginning any treatment regimen for migraine.   Q: What are preventive medications for migraine?  A: Preventive medications for migraine are sometimes referred to as "prophylactic" treatments. They are used to reduce the frequency, severity, and length of migraine attacks. Examples of preventive medications include antiepileptic medications, antidepressants, beta-blockers, calcium channel blockers, and NSAIDs (nonsteroidal anti-inflammatory drugs).  Q: Why are anticonvulsants used to treat migraine?  A: During the past few years, there has been an increased interest in antiepileptic drugs for the prevention of migraine. They are sometimes referred to as "anticonvulsants". Both epilepsy and migraine may be caused by similar reactions in the brain.   Q: Why are antidepressants used to treat migraine?  A: Antidepressants are typically used to treat people with depression. They may reduce migraine frequency by regulating chemical levels, such as serotonin, in the brain.   Q: What alternative therapies are used to treat migraine?  A: The term "alternative therapies" is often used to describe treatments considered outside the scope of conventional Western medicine. Examples of alternative therapy include acupuncture, acupressure, and yoga. Another common alternative treatment is herbal therapy. Some herbs are believed to relieve headache pain. Always discuss alternative therapies with your caregiver before proceeding. Some herbal products contain arsenic and other toxins.  TENSION HEADACHES  Q: What is a tension-type headache? What causes it? How can I treat it?  A: Tension-type headaches occur randomly. They are often the result of temporary stress, anxiety, fatigue, or anger. Symptoms include  soreness in your temples, a tightening band-like sensation around your head (a "vice-like" ache). Symptoms can also include a pulling feeling, pressure sensations, and contracting head and neck muscles. The headache begins in your forehead, temples, or the back of your head and neck. Treatment for tension-type headache may include over-the-counter or prescription medications. Treatment may also include self-help techniques such as relaxation training and biofeedback.  CLUSTER HEADACHES  Q: What is a cluster headache? What causes it? How can I treat it?  A: Cluster headache gets its name because the attacks come in groups. The pain arrives with little, if any, warning. It is usually on one side of the head. A tearing or bloodshot eye and   a runny nose on the same side of the headache may also accompany the pain. Cluster headaches are believed to be caused by chemical reactions in the brain. They have been described as the most severe and intense of any headache type. Treatment for cluster headache includes prescription medication and oxygen.  SINUS HEADACHES  Q: What is a sinus headache? What causes it? How can I treat it?  A: When a cavity in the bones of the face and skull (a sinus) becomes inflamed, the inflammation will cause localized pain. This condition is usually the result of an allergic reaction, a tumor, or an infection. If your headache is caused by a sinus blockage, such as an infection, you will probably have a fever. An x-ray will confirm a sinus blockage. Your caregiver's treatment might include antibiotics for the infection, as well as antihistamines or decongestants.   REBOUND HEADACHES  Q: What is a rebound headache? What causes it? How can I treat it?  A: A pattern of taking acute headache medications too often can lead to a condition known as "rebound headache." A pattern of taking too much headache medication includes taking it more than 2 days per week or in excessive amounts. That means more  than the label or a caregiver advises. With rebound headaches, your medications not only stop relieving pain, they actually begin to cause headaches. Doctors treat rebound headache by tapering the medication that is being overused. Sometimes your caregiver will gradually substitute a different type of treatment or medication. Stopping may be a challenge. Regularly overusing a medication increases the potential for serious side effects. Consult a caregiver if you regularly use headache medications more than 2 days per week or more than the label advises.  ADDITIONAL QUESTIONS AND ANSWERS  Q: What is biofeedback?  A: Biofeedback is a self-help treatment. Biofeedback uses special equipment to monitor your body's involuntary physical responses. Biofeedback monitors:  · Breathing.  · Pulse.  · Heart rate.  · Temperature.  · Muscle tension.  · Brain activity.  Biofeedback helps you refine and perfect your relaxation exercises. You learn to control the physical responses that are related to stress. Once the technique has been mastered, you do not need the equipment any more.  Q: Are headaches hereditary?  A: Four out of five (80%) of people that suffer report a family history of migraine. Scientists are not sure if this is genetic or a family predisposition. Despite the uncertainty, a child has a 50% chance of having migraine if one parent suffers. The child has a 75% chance if both parents suffer.   Q: Can children get headaches?  A: By the time they reach high school, most young people have experienced some type of headache. Many safe and effective approaches or medications can prevent a headache from occurring or stop it after it has begun.   Q: What type of doctor should I see to diagnose and treat my headache?  A: Start with your primary caregiver. Discuss his or her experience and approach to headaches. Discuss methods of classification, diagnosis, and treatment. Your caregiver may decide to recommend you to a  headache specialist, depending upon your symptoms or other physical conditions. Having diabetes, allergies, etc., may require a more comprehensive and inclusive approach to your headache. The National [employer] will provide, upon request, a list of NHF physician members in your state.  Document Released: 08/02/2003 Document Revised: 08/04/2011 Document Reviewed: 01/10/2008  ExitCare® Patient Information ©2015 [employer]. This information is   not intended to replace advice given to you by your health care provider. Make sure you discuss any questions you have with your health care provider.

## 2015-04-11 ENCOUNTER — Emergency Department (HOSPITAL_BASED_OUTPATIENT_CLINIC_OR_DEPARTMENT_OTHER)
Admission: EM | Admit: 2015-04-11 | Discharge: 2015-04-11 | Disposition: A | Discharge status: Home / Self Care | Payer: Self-pay | Attending: Emergency Medicine | Admitting: Emergency Medicine

## 2015-04-11 ENCOUNTER — Encounter (HOSPITAL_BASED_OUTPATIENT_CLINIC_OR_DEPARTMENT_OTHER): Payer: Self-pay | Admitting: Emergency Medicine

## 2015-04-11 DIAGNOSIS — N730 Acute parametritis and pelvic cellulitis: Secondary | ICD-10-CM

## 2015-04-11 DIAGNOSIS — F17210 Nicotine dependence, cigarettes, uncomplicated: Secondary | ICD-10-CM | POA: Insufficient documentation

## 2015-04-11 DIAGNOSIS — A549 Gonococcal infection, unspecified: Secondary | ICD-10-CM | POA: Insufficient documentation

## 2015-04-11 DIAGNOSIS — Z3202 Encounter for pregnancy test, result negative: Secondary | ICD-10-CM | POA: Insufficient documentation

## 2015-04-11 DIAGNOSIS — N739 Female pelvic inflammatory disease, unspecified: Secondary | ICD-10-CM | POA: Insufficient documentation

## 2015-04-11 DIAGNOSIS — Z791 Long term (current) use of non-steroidal anti-inflammatories (NSAID): Secondary | ICD-10-CM | POA: Insufficient documentation

## 2015-04-11 LAB — COMPREHENSIVE METABOLIC PANEL
ALBUMIN: 3.9 g/dL (ref 3.5–5.0)
ALT: 15 U/L (ref 14–54)
ANION GAP: 8 (ref 5–15)
AST: 20 U/L (ref 15–41)
Alkaline Phosphatase: 57 U/L (ref 38–126)
BILIRUBIN TOTAL: 0.4 mg/dL (ref 0.3–1.2)
BUN: 12 mg/dL (ref 6–20)
CHLORIDE: 104 mmol/L (ref 101–111)
CO2: 24 mmol/L (ref 22–32)
Calcium: 9.1 mg/dL (ref 8.9–10.3)
Creatinine, Ser: 0.61 mg/dL (ref 0.44–1.00)
GFR calc Af Amer: >60 mL/min (ref >60)
GFR calc non Af Amer: >60 mL/min (ref >60)
GLUCOSE: 89 mg/dL (ref 65–99)
Potassium: 3.5 mmol/L (ref 3.5–5.1)
Sodium: 136 mmol/L (ref 135–145)
TOTAL PROTEIN: 7.6 g/dL (ref 6.5–8.1)

## 2015-04-11 LAB — CBC
HCT: 36 % (ref 36.0–46.0)
HEMOGLOBIN: 12.2 g/dL (ref 12.0–15.0)
MCH: 33.4 pg (ref 26.0–34.0)
MCHC: 33.9 g/dL (ref 30.0–36.0)
MCV: 98.6 fL (ref 78.0–100.0)
Platelets: 287 10*3/uL (ref 150–400)
RBC: 3.65 MIL/uL — ABNORMAL LOW (ref 3.87–5.11)
RDW: 11.6 % (ref 11.5–15.5)
WBC: 10.4 10*3/uL (ref 4.0–10.5)

## 2015-04-11 LAB — WET PREP, GENITAL
Sperm: NONE SEEN
TRICH WET PREP: NONE SEEN
YEAST WET PREP: NONE SEEN

## 2015-04-11 LAB — URINALYSIS, ROUTINE W REFLEX MICROSCOPIC
Bilirubin Urine: NEGATIVE
GLUCOSE, UA: NEGATIVE mg/dL
Ketones, ur: NEGATIVE mg/dL
Nitrite: NEGATIVE
PH: 5.5 (ref 5.0–8.0)
Protein, ur: NEGATIVE mg/dL
SPECIFIC GRAVITY, URINE: 1.006 (ref 1.005–1.030)

## 2015-04-11 LAB — PREGNANCY, URINE: Preg Test, Ur: NEGATIVE

## 2015-04-11 LAB — URINE MICROSCOPIC-ADD ON

## 2015-04-11 MED ORDER — AZITHROMYCIN 250 MG PO TABS
1000.0000 mg | ORAL_TABLET | Freq: Once | ORAL | Status: AC
  Administered 2015-04-11: 1000 mg via ORAL
  Filled 2015-04-11: qty 4

## 2015-04-11 MED ORDER — DOXYCYCLINE HYCLATE 100 MG PO CAPS: 100.0000 mg | ORAL_CAPSULE | Freq: Two times a day (BID) | ORAL | Status: DC

## 2015-04-11 MED ORDER — CEFTRIAXONE SODIUM 250 MG IJ SOLR
250.0000 mg | Freq: Once | INTRAMUSCULAR | Status: AC
  Administered 2015-04-11: 250 mg via INTRAMUSCULAR
  Filled 2015-04-11: qty 250

## 2015-04-11 NOTE — Discharge Instructions (Signed)

## 2015-04-11 NOTE — ED Provider Notes (Signed)
CSN: [id number]     Arrival date & time 04/11/15  2048 History  By signing my name below, I, Soijett Blue, attest that this documentation has been prepared under the direction and in the presence of Lyndal Pulley, MD. Electronically Signed: Soijett Blue, ED Scribe. 04/11/2015. 9:28 PM.   Chief Complaint  Patient presents with  . Abdominal Pain      Patient is a 34 y.o. female presenting with abdominal pain. The history is provided by the patient. No language interpreter was used.  Abdominal Pain Pain location:  Suprapubic Pain quality: dull and pressure   Pain radiates to:  Back Pain severity:  Moderate Onset quality:  Gradual Duration:  4 days Timing:  Intermittent Progression:  Worsening Chronicity:  New Context: recent sexual activity   Relieved by:  NSAIDs Worsened by:  Palpation Ineffective treatments:  None tried Associated symptoms: vaginal discharge (clear)   Associated symptoms: no diarrhea, no dysuria, no fever, no hematuria, no nausea, no vaginal bleeding and no vomiting    HPI Comments: Alejandra Garza is a 34 y.o. female who presents to the Emergency Department complaining of intermittent, moderate, abdominal pain onset 4 days. Pt has a new sexual partner whom she has been in a relationship with for 3 months. She states that she had unprotected sex with her partner 4 days ago and that is when her abdominal pain began. She states that she has tried motrin with no relief for her symptoms. She denies frequency, malodorous urine, vaginal itching/bleeding, and any other symptoms. Patient's last menstrual period was 03/29/2015 and it lasted 8 days. She notes that her pain started following the end of her period.   History reviewed. No pertinent past medical history. History reviewed. No pertinent past surgical history. No family history on file. Social History  Substance Use Topics  . Smoking status: Current Every Day Smoker -- 1.00 packs/day    Types: Cigarettes  .  Smokeless tobacco: None  . Alcohol Use: 4.2 oz/week    7 Cans of beer per week     Comment: 1 drink daily   OB History    No data available     Review of Systems  Constitutional: Negative for fever.  Gastrointestinal: Positive for abdominal pain. Negative for nausea, vomiting and diarrhea.  Genitourinary: Positive for vaginal discharge (clear). Negative for dysuria, hematuria and vaginal bleeding.  All other systems reviewed and are negative.     Allergies  Review of patient's allergies indicates no known allergies.  Home Medications   Prior to Admission medications   Medication Sig Start Date End Date Taking? Authorizing Provider  ibuprofen (ADVIL,MOTRIN) 800 MG tablet Take 1 tablet (800 mg total) by mouth 3 (three) times daily. 09/26/14  Yes Lonia Skinner Sofia, PA-C  cephALEXin (KEFLEX) 500 MG capsule Take 1 capsule (500 mg total) by mouth 4 (four) times daily. Patient not taking: Reported on 09/26/2014 02/13/14   Geoffery Lyons, MD  methocarbamol (ROBAXIN) 500 MG tablet Take 1 tablet (500 mg total) by mouth 2 (two) times daily. 09/26/14   Elson Areas, PA-C   BP 123/89 mmHg  Pulse 107  Temp(Src) 98.3 F (36.8 C) (Oral)  Resp 18  Ht  (1.626 m)  Wt 107 lb 8 oz (48.762 kg)  BMI 18.44 kg/m2  SpO2 99%  LMP 04/04/2015 Physical Exam  Constitutional: She is oriented to person, place, and time. She appears well-developed and well-nourished. No distress.  HENT:  Head: Normocephalic and atraumatic.  Eyes: EOM are  normal.  Neck: Neck supple.  Cardiovascular: Normal rate.   Pulmonary/Chest: Effort normal. No respiratory distress.  Abdominal: Soft. There is tenderness in the suprapubic area. There is no rebound and no guarding.  Mild suprapubic tenderness worse on the right  Genitourinary: Cervix exhibits no motion tenderness. Right adnexum displays no tenderness. Left adnexum displays no tenderness. Vaginal discharge found.  Yellow mucopurulent discharge.   Musculoskeletal: Normal  range of motion.  Neurological: She is alert and oriented to person, place, and time.  Skin: Skin is warm and dry.  Psychiatric: She has a normal mood and affect. Her behavior is normal.  Nursing note and vitals reviewed.   ED Course  Procedures (including critical care time) DIAGNOSTIC STUDIES: Oxygen Saturation is 99% on RA, nl by my interpretation.    COORDINATION OF CARE: 9:28 PM Discussed treatment plan with pt at bedside which includes UA, labs and pt agreed to plan.    Labs Review Labs Reviewed  WET PREP, GENITAL - Abnormal; Notable for the following:    Clue Cells Wet Prep HPF POC PRESENT (*)    WBC, Wet Prep HPF POC MANY (*)    All other components within normal limits  CBC - Abnormal; Notable for the following:    RBC 3.65 (*)    All other components within normal limits  URINALYSIS, ROUTINE W REFLEX MICROSCOPIC (NOT AT [facility]) - Abnormal; Notable for the following:    APPearance CLOUDY (*)    Hgb urine dipstick SMALL (*)    Leukocytes, UA MODERATE (*)    All other components within normal limits  URINE MICROSCOPIC-ADD ON - Abnormal; Notable for the following:    Squamous Epithelial / LPF 0-5 (*)    Bacteria, UA RARE (*)    All other components within normal limits  COMPREHENSIVE METABOLIC PANEL  PREGNANCY, URINE  GC/CHLAMYDIA PROBE AMP ([place]) NOT AT [facility]    Imaging Review No results found. I have personally reviewed and evaluated these images and lab results as part of my medical decision-making.   EKG Interpretation None      MDM   Final diagnoses:  Gonorrhea  PID (acute pelvic inflammatory disease)    34 year old feel presents with concern for lower abdominal pain and vaginal discharge. On exam she does have mucopurulent drainage concerning for acute gonococcal infection and with recent abdominal pain there is concern with extension to PID. She was given empiric antibiotics here and provided doxycycline for home therapy. Recommended that  partners also be treated. No evidence of TOA on exam, no other signs of surgical illness are evident and screening labs are unremarkable.  I personally performed the services described in this documentation, which was scribed in my presence. The recorded information has been reviewed and is accurate.     Lyndal Pulleyaniel Ardyth Kelso, MD 04/12/15 [phone]

## 2015-04-11 NOTE — ED Notes (Signed)
34 yo with lower abdominal pain and pressure with urination. Denies Fever N/V/D. States that the pain has increased since the beginning of the week. Has taken ibuprofen.

## 2015-04-12 LAB — GC/CHLAMYDIA PROBE AMP (~~LOC~~) NOT AT ARMC
Chlamydia: NEGATIVE
Neisseria Gonorrhea: POSITIVE — AB

## 2015-04-13 ENCOUNTER — Telehealth (HOSPITAL_BASED_OUTPATIENT_CLINIC_OR_DEPARTMENT_OTHER): Payer: Self-pay | Admitting: *Deleted

## 2016-03-10 ENCOUNTER — Encounter (HOSPITAL_COMMUNITY): Payer: Self-pay | Admitting: Emergency Medicine

## 2016-03-10 ENCOUNTER — Emergency Department (HOSPITAL_COMMUNITY)
Admission: EM | Admit: 2016-03-10 | Discharge: 2016-03-10 | Disposition: A | Discharge status: Home / Self Care | Payer: Self-pay | Attending: Emergency Medicine | Admitting: Emergency Medicine

## 2016-03-10 DIAGNOSIS — N63 Unspecified lump in unspecified breast: Secondary | ICD-10-CM

## 2016-03-10 DIAGNOSIS — F17210 Nicotine dependence, cigarettes, uncomplicated: Secondary | ICD-10-CM | POA: Insufficient documentation

## 2016-03-10 DIAGNOSIS — N6320 Unspecified lump in the left breast, unspecified quadrant: Secondary | ICD-10-CM | POA: Insufficient documentation

## 2016-03-10 NOTE — ED Triage Notes (Signed)
Pt reports left breast lump x 2 weeks. No pain. sts hard upon palpation. No further symptoms. sts no PCP.

## 2016-03-10 NOTE — ED Provider Notes (Signed)
[facility]-EMERGENCY DEPT Provider Note   CSN: [id number] Arrival date & time: 03/10/16  1303     History   Chief Complaint Chief Complaint  Patient presents with  . Breast Mass    left    HPI Alejandra Garza is a 35 y.o. female.  HPI  CC: breast lump  Onset/Duration: 2 weeks Location: left medial  Quality: lump Modifying Factors:  Improved by: nothing  Worsened by: nothing Associated Signs/Symptoms:  Pertinent (+): nothing  Pertinent (-): fever, chills, pain, night sweats, weight loss Context: strong family h/o cancer. No PCP follow up.  History reviewed. No pertinent past medical history.  Patient Active Problem List   Diagnosis Date Noted  . PRURITUS 11/16/2008  . SYNCOPE 11/16/2008  . SYMPTOM, HEADACHE 10/12/2006  . TOBACCO DEPENDENCE 07/23/2006  . RHINITIS, ALLERGIC 07/23/2006    History reviewed. No pertinent surgical history.  OB History    No data available       Home Medications    Prior to Admission medications   Medication Sig Start Date End Date Taking? Authorizing Provider  ibuprofen (ADVIL,MOTRIN) 200 MG tablet Take 400 mg by mouth every 6 (six) hours as needed for headache, mild pain or moderate pain.   Yes Historical Provider, MD    Family History No family history on file.  Social History Social History  Substance Use Topics  . Smoking status: Current Every Day Smoker    Packs/day: 1.00    Types: Cigarettes  . Smokeless tobacco: Never Used  . Alcohol use 4.2 oz/week    7 Cans of beer per week     Comment: 1 drink daily     Allergies   Review of patient's allergies indicates no known allergies.   Review of Systems Review of Systems  Constitutional: Negative for chills and fever.  HENT: Negative for ear pain and sore throat.   Eyes: Negative for pain and visual disturbance.  Respiratory: Negative for cough and shortness of breath.   Cardiovascular: Negative for chest pain and palpitations.  Gastrointestinal: Negative for  abdominal pain and vomiting.  Genitourinary: Negative for dysuria and hematuria.  Musculoskeletal: Negative for arthralgias and back pain.  Skin: Negative for color change and rash.  Neurological: Negative for seizures and syncope.  All other systems reviewed and are negative.    Physical Exam Updated Vital Signs BP 136/94   Pulse 70   Temp 98.2 F (36.8 C) (Oral)   Resp 15   Ht 5\' 4"  (1.626 m)   Wt 110 lb 2 oz (50 kg)   LMP 02/25/2016   SpO2 100%   BMI 18.90 kg/m   Physical Exam  Pulmonary/Chest:       ED Treatments / Results  Labs (all labs ordered are listed, but only abnormal results are displayed) Labs Reviewed - No data to display  EKG  EKG Interpretation None       Radiology No results found.  Procedures Procedures (including critical care time)  Emergency Focused Ultrasound Exam Limited Ultrasound of Soft Tissue   Performed and interpreted by Dr. Eudelia Bunchardama Indication: evaluation for infection or foreign body Transverse and Sagittal views of left breast are obtained in real time for the purposes of evaluation of skin and underlying soft tissues.  Findings: no heterogeneous fluid collection, no hyperemia/edema of surrounding tissue Interpretation: nodule  Images archived electronically.  CPT Codes:  Breast [id number]   Medications Ordered in ED Medications - No data to display   Initial Impression / Assessment and Plan /  ED Course  I have reviewed the triage vital signs and the nursing notes.  Pertinent labs & imaging results that were available during my care of the patient were reviewed by me and considered in my medical decision making (see chart for details).  Clinical Course    Left breast mass. No abscess or cyst noted. Needs PCP/Gyn follow up for further evaluation.   Final Clinical Impressions(s) / ED Diagnoses   Final diagnoses:  Breast mass   Disposition: Discharge  Condition: Good  I have discussed the results, Dx and  Tx plan with the patient who expressed understanding and agree(s) with the plan. Discharge instructions discussed at great length. The patient was given strict return precautions who verbalized understanding of the instructions. No further questions at time of discharge.    Current Discharge Medication List      Follow Up: [facility] [address] [place] [address] [phone] Call  For help establishing care with a care provider  [facility] [address] [place] [address] [phone] Call  For help establishing care with a care provider  [facility] [address] [place] [address] [phone]  Call  For help establishing care with a care provider      Nira Conn, MD 03/10/16 1750

## 2016-03-10 NOTE — ED Notes (Signed)
Urine collected in triage.

## 2016-03-10 NOTE — ED Notes (Signed)
Pt reports noticing a "lump" in her L breast x 2 days ago.  Denies any pain at this time.  States hx of breast cancer in her family.  Pea-sized mass palpated in her L breast.

## 2016-03-17 ENCOUNTER — Other Ambulatory Visit (HOSPITAL_COMMUNITY): Payer: Self-pay | Admitting: *Deleted

## 2016-03-17 DIAGNOSIS — N6320 Unspecified lump in the left breast, unspecified quadrant: Secondary | ICD-10-CM

## 2016-03-27 ENCOUNTER — Ambulatory Visit
Admission: RE | Admit: 2016-03-27 | Discharge: 2016-03-27 | Disposition: A | Discharge status: Home / Self Care | Payer: No Typology Code available for payment source | Source: Ambulatory Visit | Attending: Obstetrics and Gynecology | Admitting: Obstetrics and Gynecology

## 2016-03-27 ENCOUNTER — Encounter (HOSPITAL_COMMUNITY): Payer: Self-pay

## 2016-03-27 ENCOUNTER — Other Ambulatory Visit (HOSPITAL_COMMUNITY): Payer: Self-pay | Admitting: *Deleted

## 2016-03-27 ENCOUNTER — Ambulatory Visit (HOSPITAL_COMMUNITY)
Admission: RE | Admit: 2016-03-27 | Discharge: 2016-03-27 | Disposition: A | Discharge status: Home / Self Care | Payer: Self-pay | Source: Ambulatory Visit | Attending: Obstetrics and Gynecology | Admitting: Obstetrics and Gynecology

## 2016-03-27 VITALS — BP 110/72 | Temp 98.6°F | Ht 64.0 in | Wt 104.6 lb

## 2016-03-27 DIAGNOSIS — N6320 Unspecified lump in the left breast, unspecified quadrant: Secondary | ICD-10-CM

## 2016-03-27 DIAGNOSIS — N6310 Unspecified lump in the right breast, unspecified quadrant: Secondary | ICD-10-CM

## 2016-03-27 DIAGNOSIS — N6324 Unspecified lump in the left breast, lower inner quadrant: Secondary | ICD-10-CM

## 2016-03-27 DIAGNOSIS — N6315 Unspecified lump in the right breast, overlapping quadrants: Secondary | ICD-10-CM

## 2016-03-27 DIAGNOSIS — Z01419 Encounter for gynecological examination (general) (routine) without abnormal findings: Secondary | ICD-10-CM

## 2016-03-27 HISTORY — DX: Anemia, unspecified: D64.9

## 2016-03-27 NOTE — Progress Notes (Signed)
Pap smear completed

## 2016-03-27 NOTE — Progress Notes (Signed)
Complaints of left breast lump x 1 month.   Pap Smear: Pap smear completed today. [facility] and abnormal with positive HPV per patient. Per patient has a history of multiple abnormal Pap smears over the past 10 years. 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 Patient stated she thinks she has had a colposcopy completed in the past for follow up for one of her abnormal Pap smears. Last Pap smear result is not in EPIC but previous Pap smear result from 07/19/2007 is in EPIC.  Physical exam: Breasts Breasts symmetrical. No skin abnormalities bilateral breasts. No nipple retraction bilateral breasts. No nipple discharge bilateral breasts. No lymphadenopathy. Palpated a mobile lump within the left breast at 8 o'clock 3 cm from the nipple. Palpated a lump within the right breast at 3 o'clock next to areola. Complaints of tenderness when palpated right breast lump. [facility] and possible bilateral breast ultrasound. Appointment scheduled for Thursday, March 27, 2016 at 0910.   Pelvic/Bimanual   Ext Genitalia No lesions, no swelling and no discharge observed on external genitalia.         Vagina Vagina pink and normal texture. No lesions or discharge observed in vagina.          Cervix Cervix is present. Cervix pink and of normal texture. No discharge observed.     Uterus Uterus is present and palpable. Uterus in normal position and normal size.        Adnexae Bilateral ovaries present and palpable. No tenderness on palpation.          Rectovaginal No rectal exam completed today since patient had no rectal complaints. No skin abnormalities observed on exam.    Smoking History: Patient is a current smoker. Discussed smoking cessation with patient. Referred [facility] and gave resources to free smoking cessation classes at [facility].  Patient  Navigation: Patient education provided. [facility].

## 2016-03-27 NOTE — Patient Instructions (Signed)
Explained breast self awareness to Alejandra PeoplesErica D Garza. Let patient know that her next Pap smear will be due in one year if today's Pap smear is normal due to her history of abnormal Pap smears. [facility] for a diagnostic mammogram and possible bilateral breast ultrasound. Appointment scheduled for Thursday, March 27, 2016 at 0910. Let patient know will follow up with her within the next couple weeks with results with results of Pap smear by phone. Discussed smoking cessation with patient. Ref[facility] the [facility] and gave resources to free smoking cessation classes at [facility]. Alicia AmelErica D Garza verbalized understanding.  Rosemarie Galvis, Kathaleen Maserhristine Poll, RN 10:29 AM

## 2016-03-28 ENCOUNTER — Encounter (HOSPITAL_COMMUNITY): Payer: Self-pay | Admitting: *Deleted

## 2016-03-31 LAB — CYTOLOGY - PAP
DIAGNOSIS: NEGATIVE
HPV (WINDOPATH): NOT DETECTED

## 2016-04-07 ENCOUNTER — Other Ambulatory Visit (HOSPITAL_COMMUNITY): Payer: Self-pay | Admitting: *Deleted

## 2016-04-07 ENCOUNTER — Telehealth (HOSPITAL_COMMUNITY): Payer: Self-pay | Admitting: *Deleted

## 2016-04-07 DIAGNOSIS — A599 Trichomoniasis, unspecified: Secondary | ICD-10-CM

## 2016-04-07 MED ORDER — METRONIDAZOLE 500 MG PO TABS: 500.0000 mg | ORAL_TABLET | Freq: Two times a day (BID) | ORAL | 0 refills | Status: AC

## 2016-04-07 NOTE — Telephone Encounter (Signed)
Telephoned patient at home number and advised patient of negative pap smear results. HPV was negative. Advised patient pap smear did show trichomoniasis and medication was called into pharmacy. All medication would need to be finished and sexual partner would also need to be treated. Also no sexual contact until all medication completed and no alcohol while taking medication.Next pap smear due in one year due to history of abnormal. Patient voiced understanding.

## 2016-12-10 ENCOUNTER — Encounter (HOSPITAL_COMMUNITY): Payer: Self-pay | Admitting: *Deleted

## 2016-12-10 NOTE — Progress Notes (Signed)
Letter mailed to patient to schedule 6 month follow up.

## 2017-01-05 ENCOUNTER — Other Ambulatory Visit: Payer: Self-pay | Admitting: Obstetrics and Gynecology

## 2017-01-05 ENCOUNTER — Emergency Department (HOSPITAL_COMMUNITY)
Admission: EM | Admit: 2017-01-05 | Discharge: 2017-01-05 | Disposition: A | Discharge status: Home / Self Care | Payer: Self-pay | Attending: Emergency Medicine | Admitting: Emergency Medicine

## 2017-01-05 ENCOUNTER — Encounter (HOSPITAL_COMMUNITY): Payer: Self-pay

## 2017-01-05 DIAGNOSIS — F17210 Nicotine dependence, cigarettes, uncomplicated: Secondary | ICD-10-CM | POA: Insufficient documentation

## 2017-01-05 DIAGNOSIS — Z79899 Other long term (current) drug therapy: Secondary | ICD-10-CM | POA: Insufficient documentation

## 2017-01-05 DIAGNOSIS — N390 Urinary tract infection, site not specified: Secondary | ICD-10-CM | POA: Insufficient documentation

## 2017-01-05 LAB — URINALYSIS, ROUTINE W REFLEX MICROSCOPIC
Bilirubin Urine: NEGATIVE
GLUCOSE, UA: NEGATIVE mg/dL
Ketones, ur: NEGATIVE mg/dL
NITRITE: POSITIVE — AB
Protein, ur: 30 mg/dL — AB
Specific Gravity, Urine: 1.014 (ref 1.005–1.030)
pH: 5 (ref 5.0–8.0)

## 2017-01-05 LAB — POC URINE PREG, ED: Preg Test, Ur: NEGATIVE

## 2017-01-05 MED ORDER — CEPHALEXIN 500 MG PO CAPS: 500.0000 mg | ORAL_CAPSULE | Freq: Two times a day (BID) | ORAL | 0 refills | Status: AC

## 2017-01-05 NOTE — ED Provider Notes (Signed)
[facility]-EMERGENCY DEPT Provider Note   CSN: [id number] Arrival date & time: 01/05/17  1626  By signing my name below, I, Diona BrownerJennifer Gorman, attest that this documentation has been prepared under the direction and in the presence of Mathews RobinsonsJessica Reveca Desmarais, PA-C. Electronically Signed: Diona BrownerJennifer Gorman, ED Scribe. 01/05/17. 5:49 PM.  History   Chief Complaint Chief Complaint  Patient presents with  . Back Pain    HPI Alejandra Garza is a 36 y.o. female who presents to the Emergency Department complaining of dull, left sided back pain that started Saturday, 01/03/17, worsening to the right side of her back today, 01/05/17. Associated sx include urinary frequency. Pt has been taking ibuprofen with mild relief. Pt states she saw lite pink on the toilet paper when she wiped the other day. No recent bending, lifting, injuries. No daily medications. No allergies to medications. Pt denies fever, chills, nausea, vomiting, dysuria, hematuria, abdominal pain, vaginal discharge, incontinence, numbness, tingling, sore throat, rhinorrhea, congestion, or any other complaints at this time.   The history is provided by the patient. No language interpreter was used.    Past Medical History:  Diagnosis Date  . Anemia     Patient Active Problem List   Diagnosis Date Noted  . PRURITUS 11/16/2008  . SYNCOPE 11/16/2008  . SYMPTOM, HEADACHE 10/12/2006  . TOBACCO DEPENDENCE 07/23/2006  . RHINITIS, ALLERGIC 07/23/2006    History reviewed. No pertinent surgical history.  OB History    Gravida Para Term Preterm AB Living   1 1 1     1    SAB TAB Ectopic Multiple Live Births           1       Home Medications    Prior to Admission medications   Medication Sig Start Date End Date Taking? Authorizing Provider  cephALEXin (KEFLEX) 500 MG capsule Take 1 capsule (500 mg total) by mouth 2 (two) times daily. 01/05/17 01/12/17  Mathews RobinsonsMitchell, Lakara Weiland B, PA-C  ibuprofen (ADVIL,MOTRIN) 200 MG tablet Take 400 mg by mouth every 6  (six) hours as needed for headache, mild pain or moderate pain.    [provider]  metroNIDAZOLE (FLAGYL) 500 MG tablet Take 1 tablet (500 mg total) by mouth 2 (two) times daily. 04/07/16   Constant, Peggy, MD    Family History Family History  Problem Relation Age of Onset  . Cancer Mother        uterine  . Lupus Mother   . Cancer Maternal Grandmother        bone    Social History Social History  Substance Use Topics  . Smoking status: Current Every Day Smoker    Packs/day: 1.00    Types: Cigarettes  . Smokeless tobacco: Never Used  . Alcohol use Yes     Comment: weekends     Allergies   Patient has no known allergies.   Review of Systems Review of Systems  Constitutional: Negative for chills and fever.  HENT: Negative for congestion, rhinorrhea and sore throat.   Respiratory: Negative for cough, shortness of breath and stridor.   Gastrointestinal: Negative for abdominal distention, abdominal pain, blood in stool, nausea and vomiting.       No loss of bowel function  Genitourinary: Positive for frequency and urgency. Negative for difficulty urinating, dysuria, flank pain, hematuria, pelvic pain and vaginal discharge.       No loss of bladder function  Musculoskeletal: Positive for back pain. Negative for arthralgias, gait problem, joint swelling and myalgias.  Skin: Negative for color change, pallor, rash and wound.  Neurological: Negative for weakness and numbness.     Physical Exam Updated Vital Signs BP 102/67 (BP Location: Right Arm)   Pulse 73   Temp 99.1 F (37.3 C) (Oral)   Resp 16   Ht 5\' 4"  (1.626 m)   Wt 47.2 kg (104 lb)   LMP 12/07/2016   SpO2 100%   BMI 17.85 kg/m   Physical Exam  Constitutional: She appears well-developed and well-nourished. No distress.  Patient is afebrile, non-toxic appearing, sitting comfortably in chair in no acute distress.  HENT:  Head: Normocephalic and atraumatic.  Eyes: Conjunctivae are normal.  Neck:  Normal range of motion.  Cardiovascular: Normal rate, regular rhythm, normal heart sounds and intact distal pulses.   Pulmonary/Chest: Effort normal and breath sounds normal. No respiratory distress. She has no wheezes. She has no rales.  Abdominal: She exhibits no distension. There is no tenderness.  No CVA tenderness.  Musculoskeletal: Normal range of motion. She exhibits tenderness. She exhibits no edema or deformity.  No midline tenderness of spine. Slight tenderness of lower lumbar musculoskeletal.  Neurological: She is alert. No sensory deficit. She exhibits normal muscle tone.  5/5 strength flexion at hips, flexion and extension at knees and plantar dorsal flexion bilaterally. Normal stance and gait. Bilaterally NVI in lower extremities.  Skin: Skin is warm. No rash noted. She is not diaphoretic. No erythema. No pallor.  Psychiatric: She has a normal mood and affect. Her behavior is normal.  Nursing note and vitals reviewed.    ED Treatments / Results  DIAGNOSTIC STUDIES: Oxygen Saturation is 100% on RA, normal by my interpretation.   COORDINATION OF CARE: 5:49 PM-Discussed next steps with pt. Pt verbalized understanding and is agreeable with the plan.   Labs (all labs ordered are listed, but only abnormal results are displayed) Labs Reviewed  URINALYSIS, ROUTINE W REFLEX MICROSCOPIC - Abnormal; Notable for the following:       Result Value   APPearance CLOUDY (*)    Hgb urine dipstick MODERATE (*)    Protein, ur 30 (*)    Nitrite POSITIVE (*)    Leukocytes, UA LARGE (*)    Bacteria, UA MANY (*)    Squamous Epithelial / LPF 0-5 (*)    Non Squamous Epithelial 0-5 (*)    All other components within normal limits  URINE CULTURE  POC URINE PREG, ED    EKG  EKG Interpretation None       Radiology No results found.  Procedures Procedures (including critical care time)  Medications Ordered in ED Medications - No data to display   Initial Impression /  Assessment and Plan / ED Course  I have reviewed the triage vital signs and the nursing notes.  Pertinent labs & imaging results that were available during my care of the patient were reviewed by me and considered in my medical decision making (see chart for details).    Patient presenting with urinary tract infection and lower back musculature pain. No midline tenderness palpation of the spine. No red flags, patient has normal stance and gait and is ambulatory.  Urinalysis with clear signs of UTI. Patient is otherwise well-appearing, afebrile, nontoxic. Discharge patient home with Keflex and follow-up with PCP as needed. Patient advised to stay well-hydrated.  Discussed strict return precautions and advised to return to the emergency department if experiencing any new or worsening symptoms. Instructions were understood and patient agreed with discharge plan.  Final  Clinical Impressions(s) / ED Diagnoses   Final diagnoses:  Lower urinary tract infectious disease    New Prescriptions New Prescriptions   CEPHALEXIN (KEFLEX) 500 MG CAPSULE    Take 1 capsule (500 mg total) by mouth 2 (two) times daily.   I personally performed the services described in this documentation, which was scribed in my presence. The recorded information has been reviewed and is accurate.     Georgiana Shore, PA-C 01/05/17 Herbie Baltimore    Geoffery Lyons, MD 01/05/17 [phone]

## 2017-01-05 NOTE — ED Triage Notes (Signed)
Pt reports "pressure" with urination and right and left flank pain since Saturday. Endorses urinary frequency. Pt denies abnormal vaginal discharge/ odor.

## 2017-01-05 NOTE — ED Notes (Signed)
Pt ambulated to room from waiting area. Pt had no complaints while ambulating.  

## 2017-01-05 NOTE — Discharge Instructions (Signed)
As discussed, make sure you take your entire course of antibiotics even if you feel better. Make sure that you stay well-hydrated drinking enough water to keep your urine clear.  Follow-up with her primary care provider. Return if his symptoms worsen, fever, chills, worsening back pain, difficulty urinating or other concerning symptoms in the meantime.

## 2017-01-08 ENCOUNTER — Encounter (HOSPITAL_COMMUNITY): Payer: Self-pay | Admitting: Emergency Medicine

## 2017-01-08 ENCOUNTER — Emergency Department (HOSPITAL_COMMUNITY): Payer: Self-pay

## 2017-01-08 ENCOUNTER — Emergency Department (HOSPITAL_COMMUNITY)
Admission: EM | Admit: 2017-01-08 | Discharge: 2017-01-08 | Disposition: A | Discharge status: Home / Self Care | Payer: Self-pay | Attending: Emergency Medicine | Admitting: Emergency Medicine

## 2017-01-08 DIAGNOSIS — N10 Acute tubulo-interstitial nephritis: Secondary | ICD-10-CM | POA: Insufficient documentation

## 2017-01-08 DIAGNOSIS — F17210 Nicotine dependence, cigarettes, uncomplicated: Secondary | ICD-10-CM | POA: Insufficient documentation

## 2017-01-08 DIAGNOSIS — N12 Tubulo-interstitial nephritis, not specified as acute or chronic: Secondary | ICD-10-CM

## 2017-01-08 LAB — URINALYSIS, ROUTINE W REFLEX MICROSCOPIC
BILIRUBIN URINE: NEGATIVE
Glucose, UA: NEGATIVE mg/dL
KETONES UR: NEGATIVE mg/dL
Nitrite: NEGATIVE
PH: 5 (ref 5.0–8.0)
PROTEIN: 30 mg/dL — AB
Specific Gravity, Urine: 1.018 (ref 1.005–1.030)

## 2017-01-08 LAB — BASIC METABOLIC PANEL
Anion gap: 8 (ref 5–15)
BUN: <5 mg/dL — ABNORMAL LOW (ref 6–20)
CALCIUM: 8.5 mg/dL — AB (ref 8.9–10.3)
CO2: 26 mmol/L (ref 22–32)
CREATININE: 0.71 mg/dL (ref 0.44–1.00)
Chloride: 102 mmol/L (ref 101–111)
GLUCOSE: 101 mg/dL — AB (ref 65–99)
Potassium: 3.6 mmol/L (ref 3.5–5.1)
Sodium: 136 mmol/L (ref 135–145)

## 2017-01-08 LAB — CBC WITH DIFFERENTIAL/PLATELET
BASOS ABS: 0 10*3/uL (ref 0.0–0.1)
BASOS PCT: 0 %
EOS ABS: 0 10*3/uL (ref 0.0–0.7)
Eosinophils Relative: 0 %
HEMATOCRIT: 36.1 % (ref 36.0–46.0)
HEMOGLOBIN: 12.4 g/dL (ref 12.0–15.0)
Lymphocytes Relative: 10 %
Lymphs Abs: 1 10*3/uL (ref 0.7–4.0)
MCH: 33 pg (ref 26.0–34.0)
MCHC: 34.3 g/dL (ref 30.0–36.0)
MCV: 96 fL (ref 78.0–100.0)
MONOS PCT: 15 %
Monocytes Absolute: 1.5 10*3/uL — ABNORMAL HIGH (ref 0.1–1.0)
NEUTROS ABS: 7.4 10*3/uL (ref 1.7–7.7)
NEUTROS PCT: 75 %
Platelets: 228 10*3/uL (ref 150–400)
RBC: 3.76 MIL/uL — ABNORMAL LOW (ref 3.87–5.11)
RDW: 12.6 % (ref 11.5–15.5)
WBC: 10 10*3/uL (ref 4.0–10.5)

## 2017-01-08 LAB — I-STAT BETA HCG BLOOD, ED (MC, WL, AP ONLY): HCG, QUANTITATIVE: 12.3 m[IU]/mL — AB (ref <5)

## 2017-01-08 LAB — PREGNANCY, URINE: PREG TEST UR: NEGATIVE

## 2017-01-08 MED ORDER — DEXTROSE 5 % IV SOLN
1.0000 g | Freq: Once | INTRAVENOUS | Status: AC
  Administered 2017-01-08: 1 g via INTRAVENOUS
  Filled 2017-01-08: qty 10

## 2017-01-08 MED ORDER — ONDANSETRON 4 MG PO TBDP: 4.0000 mg | ORAL_TABLET | Freq: Three times a day (TID) | ORAL | 0 refills | Status: AC | PRN

## 2017-01-08 MED ORDER — SODIUM CHLORIDE 0.9 % IV BOLUS (SEPSIS)
1000.0000 mL | Freq: Once | INTRAVENOUS | Status: AC
  Administered 2017-01-08: 1000 mL via INTRAVENOUS

## 2017-01-08 MED ORDER — NAPROXEN 250 MG PO TABS: 250.0000 mg | ORAL_TABLET | Freq: Two times a day (BID) | ORAL | 0 refills | Status: AC

## 2017-01-08 MED ORDER — MORPHINE SULFATE (PF) 4 MG/ML IV SOLN
4.0000 mg | Freq: Once | INTRAVENOUS | Status: AC
  Administered 2017-01-08: 4 mg via INTRAVENOUS
  Filled 2017-01-08: qty 1

## 2017-01-08 MED ORDER — SULFAMETHOXAZOLE-TRIMETHOPRIM 800-160 MG PO TABS: 1.0000 | ORAL_TABLET | Freq: Two times a day (BID) | ORAL | 0 refills | Status: AC

## 2017-01-08 MED ORDER — ONDANSETRON HCL 4 MG/2ML IJ SOLN
4.0000 mg | Freq: Once | INTRAMUSCULAR | Status: AC
  Administered 2017-01-08: 4 mg via INTRAVENOUS
  Filled 2017-01-08: qty 2

## 2017-01-08 NOTE — ED Notes (Signed)
ED Provider at bedside. 

## 2017-01-08 NOTE — ED Triage Notes (Signed)
Pt sts seen here Monday for UTI and taking antibiotics but now having pain into flank and body aches

## 2017-01-08 NOTE — ED Provider Notes (Signed)
[facility]-EMER[facility]ENCY DEPT Provider Note   CSN: [id number] Arrival date & time: 01/08/17  1142     History   Chief Complaint Chief Complaint  Patient presents with  . Recurrent UTI  . Flank Pain    HPI Alejandra Garza is a 36 y.o. female.  Alejandra Garza is a 36 y.o. Female who presented to the emergency department complaining of left flank pain and symptoms of urinary tract infection ongoing for about 5 days now. Patient reports she was seen in the emergency department 2 days ago and was diagnosed with urinary tract infection. She started on Keflex twice a day. She reports she's been compliant with these medications. She reports since she started the antibiotic she's developed a left flank pain that radiates into her left lower abdomen. She also reports constipation and has not had a bowel movement in 2 days. She reports she is passing gas. She reports her pain is constant and can fluctuate in intensity. No treatments prior to arrival. She reports subjective fever at home. She reports nausea, but no vomiting. No diarrhea. She reports continued UTI symptoms of urinary pressure, urinary frequency and urinary urgency. She denies difficulty urinating, hematuria, vaginal bleeding, vaginal discharge, coughing, chest pain, shortness of breath, rashes, vomiting, hematemesis, hematochezia.   The history is provided by the patient and medical records. No language interpreter was used.  Flank Pain  Associated symptoms include abdominal pain. Pertinent negatives include no chest pain, no headaches and no shortness of breath.    Past Medical History:  Diagnosis Date  . Anemia     Patient Active Problem List   Diagnosis Date Noted  . PRURITUS 11/16/2008  . SYNCOPE 11/16/2008  . SYMPTOM, HEADACHE 10/12/2006  . TOBACCO DEPENDENCE 07/23/2006  . RHINITIS, ALLERGIC 07/23/2006    History reviewed. No pertinent surgical history.  OB History    Gravida Para Term Preterm AB Living   1 1 1     1    SAB TAB Ectopic Multiple Live Births           1       Home Medications    Prior to Admission medications   Medication Sig Start Date End Date Taking? Authorizing Provider  cephALEXin (KEFLEX) 500 MG capsule Take 1 capsule (500 mg total) by mouth 2 (two) times daily. 01/05/17 01/12/17  Mathews Robinsons B, PA-C  ibuprofen (ADVIL,MOTRIN) 200 MG tablet Take 400 mg by mouth every 6 (six) hours as needed for headache, mild pain or moderate pain.    [provider]  metroNIDAZOLE (FLAGYL) 500 MG tablet Take 1 tablet (500 mg total) by mouth 2 (two) times daily. 04/07/16   Constant, Peggy, MD  naproxen (NAPROSYN) 250 MG tablet Take 1 tablet (250 mg total) by mouth 2 (two) times daily with a meal. 01/08/17   Everlene Farrier, PA-C  ondansetron (ZOFRAN ODT) 4 MG disintegrating tablet Take 1 tablet (4 mg total) by mouth every 8 (eight) hours as needed for nausea or vomiting. 01/08/17   Everlene Farrier, PA-C  sulfamethoxazole-trimethoprim (BACTRIM DS,SEPTRA DS) 800-160 MG tablet Take 1 tablet by mouth 2 (two) times daily. 01/08/17 01/15/17  Everlene Farrier, PA-C    Family History Family History  Problem Relation Age of Onset  . Cancer Mother        uterine  . Lupus Mother   . Cancer Maternal Grandmother        bone    Social History Social History  Substance Use Topics  . Smoking status:  Current Every Day Smoker    Packs/day: 1.00    Types: Cigarettes  . Smokeless tobacco: Never Used  . Alcohol use Yes     Comment: weekends     Allergies   Patient has no known allergies.   Review of Systems Review of Systems  Constitutional: Negative for chills and fever.  HENT: Negative for congestion and sore throat.   Eyes: Negative for visual disturbance.  Respiratory: Negative for cough, shortness of breath and wheezing.   Cardiovascular: Negative for chest pain.  Gastrointestinal: Positive for abdominal pain and nausea. Negative for blood in stool, diarrhea and vomiting.    Genitourinary: Positive for dysuria, flank pain, frequency and urgency. Negative for decreased urine volume, difficulty urinating, genital sores, hematuria, menstrual problem, pelvic pain, vaginal bleeding and vaginal discharge.  Musculoskeletal: Negative for back pain and neck pain.  Skin: Negative for rash.  Neurological: Negative for syncope, light-headedness and headaches.     Physical Exam Updated Vital Signs BP 120/75 (BP Location: Right Arm)   Pulse 95   Temp 98.1 F (36.7 C) (Oral)   Resp 18   SpO2 100%   Physical Exam  Constitutional: She appears well-developed and well-nourished. No distress.  Nontoxic appearing.  HENT:  Head: Normocephalic and atraumatic.  Mouth/Throat: Oropharynx is clear and moist.  Eyes: Pupils are equal, round, and reactive to light. Conjunctivae are normal. Right eye exhibits no discharge. Left eye exhibits no discharge.  Neck: Neck supple.  Cardiovascular: Normal rate, regular rhythm, normal heart sounds and intact distal pulses.  Exam reveals no gallop and no friction rub.   No murmur heard. Pulmonary/Chest: Effort normal and breath sounds normal. No respiratory distress. She has no wheezes. She has no rales.  Abdominal: Soft. Bowel sounds are normal. She exhibits no distension and no mass. There is tenderness. There is no guarding.  Abdomen is soft. Bowel sounds are present. Patient has mild left lower quadrant abdominal tenderness to palpation and left CVA tenderness to palpation.  Musculoskeletal: She exhibits no edema.  Lymphadenopathy:    She has no cervical adenopathy.  Neurological: She is alert. Coordination normal.  Skin: Skin is warm and dry. No rash noted. She is not diaphoretic. No erythema. No pallor.  Psychiatric: She has a normal mood and affect. Her behavior is normal.  Nursing note and vitals reviewed.    ED Treatments / Results  Labs (all labs ordered are listed, but only abnormal results are displayed) Labs Reviewed   URINALYSIS, ROUTINE W REFLEX MICROSCOPIC - Abnormal; Notable for the following:       Result Value   APPearance HAZY (*)    Hgb urine dipstick MODERATE (*)    Protein, ur 30 (*)    Leukocytes, UA TRACE (*)    Bacteria, UA RARE (*)    Squamous Epithelial / LPF 6-30 (*)    Non Squamous Epithelial 0-5 (*)    All other components within normal limits  CBC WITH DIFFERENTIAL/PLATELET - Abnormal; Notable for the following:    RBC 3.76 (*)    Monocytes Absolute 1.5 (*)    All other components within normal limits  BASIC METABOLIC PANEL - Abnormal; Notable for the following:    Glucose, Bld 101 (*)    BUN <5 (*)    Calcium 8.5 (*)    All other components within normal limits  I-STAT BETA HCG BLOOD, ED ([facility], [facility], [facility] ONLY) - Abnormal; Notable for the following:    I-stat hCG, quantitative 12.3 (*)  All other components within normal limits  URINE CULTURE  PREGNANCY, URINE    EKG  EKG Interpretation None       Radiology Ct Renal Stone Study  Result Date: 01/08/2017 CLINICAL DATA:  36 year old female with history of left-sided flank pain for the past 2 days. EXAM: CT ABDOMEN AND PELVIS WITHOUT CONTRAST TECHNIQUE: Multidetector CT imaging of the abdomen and pelvis was performed following the standard protocol without IV contrast. COMPARISON:  No priors. FINDINGS: Lower chest: Unremarkable. Hepatobiliary: 5 mm low-attenuation lesion in segment 2 of the liver, incompletely characterized on today's noncontrast CT examination, but statistically likely a cyst. No other larger more suspicious appearing hepatic lesions are noted on today's noncontrast CT examination. Unenhanced appearance of the gallbladder is normal. Pancreas: No definite pancreatic mass or peripancreatic inflammatory changes are noted on today's noncontrast CT examination. Spleen: Unremarkable. Adrenals/Urinary Tract: There are no abnormal calcifications within the collecting system of either kidney, along the course of either  ureter, or within the lumen of the urinary bladder. No hydroureteronephrosis or perinephric stranding to suggest urinary tract obstruction at this time. The unenhanced appearance of the kidneys is unremarkable bilaterally. Unenhanced appearance of the urinary bladder is normal. Bilateral adrenal glands are normal in appearance. Stomach/Bowel: Unenhanced appearance of the stomach is normal. There is no pathologic dilatation of small bowel or colon. Normal appendix. Vascular/Lymphatic: No atherosclerotic calcifications or definite aneurysm identified in the abdominal or pelvic vasculature. No lymphadenopathy noted in the abdomen or pelvis on today's noncontrast CT examination. Reproductive: Unenhanced appearance of the uterus and ovaries is unremarkable. Other: No significant volume of ascites.  No pneumoperitoneum. Musculoskeletal: There are no aggressive appearing lytic or blastic lesions noted in the visualized portions of the skeleton. IMPRESSION: 1. No acute findings noted in the abdomen or pelvis to account for the patient's symptoms. Specifically, no urinary tract calculi no findings to suggest urinary tract obstruction at this time. 2. Incidental findings, as above. Electronically Signed   By: Trudie Reed M.D.   On: 01/08/2017 16:17    Procedures Procedures (including critical care time)  Medications Ordered in ED Medications  cefTRIAXone (ROCEPHIN) 1 g in dextrose 5 % 50 mL IVPB (1 g Intravenous New Bag/Given 01/08/17 1710)  ondansetron (ZOFRAN) injection 4 mg (4 mg Intravenous Given 01/08/17 1346)  sodium chloride 0.9 % bolus 1,000 mL (0 mLs Intravenous Stopped 01/08/17 1544)  morphine 4 MG/ML injection 4 mg (4 mg Intravenous Given 01/08/17 1347)  morphine 4 MG/ML injection 4 mg (4 mg Intravenous Given 01/08/17 1543)     Initial Impression / Assessment and Plan / ED Course  I have reviewed the triage vital signs and the nursing notes.  Pertinent labs & imaging results that were available  during my care of the patient were reviewed by me and considered in my medical decision making (see chart for details).  Clinical Course as of Jan 08 1721  Thu Jan 08, 2017  1513 Suspect this is falsely elevated. Patient reports she uses protection every time and there is no chance she is pregnant. Urine pregnancy is negative.  I-stat hCG, quantitative: (!) 12.3 [WD]    Clinical Course User Index [WD] Everlene Farrier, PA-C     This  is a 36 y.o. Female who presented to the emergency department complaining of left flank pain and symptoms of urinary tract infection ongoing for about 5 days now. Patient reports she was seen in the emergency department 2 days ago and was diagnosed with urinary tract infection.  She started on Keflex twice a day. She reports she's been compliant with these medications. She reports since she started the antibiotic she's developed a left flank pain that radiates into her left lower abdomen. She also reports constipation and has not had a bowel movement in 2 days. She reports she is passing gas. She reports her pain is constant and can fluctuate in intensity. No treatments prior to arrival. She reports subjective fever at home. She reports nausea, but no vomiting. No diarrhea. She reports continued UTI symptoms of urinary pressure, urinary frequency and urinary urgency. She denies difficulty urinating, hematuria, vaginal bleeding, vaginal discharge.  On exam patient is afebrile nontoxic appearing. Her abdomen is soft and she has left lower quadrant and left flank tenderness to palpation.  We'll provide with fluids, nausea medicine and pain medicine and obtain blood work and CT renal stone study. Patient agrees with plan.  CBC shows no leukocytosis. CMP is unremarkable. Urinalysis today is nitrite negative with trace leukocytes. Urine sent for culture. Urine from 3 days ago is nitrite positive with large leukocytes. I-STAT hCG returned slightly elevated at 12.3. Patient tells  me she is sexually active but uses protection. There is no chance she could be pregnant. She is not late on her menstrual cycle. Urine pregnancy test is negative. Suspect this is falsely elevated. I did discuss the possibility of this being positive in encouraged her to follow-up on this. Patient agrees.  CT renal stone study is unremarkable. No urinary tract stones or evidence of UTI on CT renal stone study.  I reevaluation patient reports feeling better. She is tolerating by mouth. Patient read with a gram of Rocephin in the emergency department. Will treat for pyelonephritis with her flank pain and symptoms of UTI. Will change from Keflex twice a day to Bactrim for 14 days to cover for pyelonephritis. Patient agrees with plan. I discussed strict and specific return precautions. I advised the patient to follow-up with their primary care provider this week. I advised the patient to return to the emergency department with new or worsening symptoms or new concerns. The patient verbalized understanding and agreement with plan.      Final Clinical Impressions(s) / ED Diagnoses   Final diagnoses:  Pyelonephritis    New Prescriptions New Prescriptions   NAPROXEN (NAPROSYN) 250 MG TABLET    Take 1 tablet (250 mg total) by mouth 2 (two) times daily with a meal.   ONDANSETRON (ZOFRAN ODT) 4 MG DISINTEGRATING TABLET    Take 1 tablet (4 mg total) by mouth every 8 (eight) hours as needed for nausea or vomiting.   SULFAMETHOXAZOLE-TRIMETHOPRIM (BACTRIM DS,SEPTRA DS) 800-160 MG TABLET    Take 1 tablet by mouth 2 (two) times daily.     Everlene FarrierDansie, Jacqulyne Gladue, PA-C 01/08/17 1726    Little, Ambrose Finlandachel Morgan, MD 01/10/17 2110

## 2017-01-08 NOTE — ED Notes (Signed)
Per CT pt to be transported in approx 15 mins

## 2017-01-08 NOTE — ED Notes (Signed)
Patient transported to CT 

## 2017-01-10 LAB — URINE CULTURE: Culture: NO GROWTH

## 2017-07-20 ENCOUNTER — Other Ambulatory Visit (HOSPITAL_COMMUNITY): Payer: Self-pay | Admitting: *Deleted

## 2017-07-20 DIAGNOSIS — N6320 Unspecified lump in the left breast, unspecified quadrant: Principal | ICD-10-CM

## 2017-07-20 DIAGNOSIS — N6310 Unspecified lump in the right breast, unspecified quadrant: Secondary | ICD-10-CM

## 2017-08-20 ENCOUNTER — Ambulatory Visit
Admission: RE | Admit: 2017-08-20 | Discharge: 2017-08-20 | Disposition: A | Discharge status: Home / Self Care | Payer: No Typology Code available for payment source | Source: Ambulatory Visit | Attending: Obstetrics and Gynecology | Admitting: Obstetrics and Gynecology

## 2017-08-20 ENCOUNTER — Encounter (HOSPITAL_COMMUNITY): Payer: Self-pay

## 2017-08-20 ENCOUNTER — Other Ambulatory Visit (HOSPITAL_COMMUNITY): Payer: Self-pay | Admitting: Obstetrics and Gynecology

## 2017-08-20 ENCOUNTER — Ambulatory Visit (HOSPITAL_COMMUNITY)
Admission: RE | Admit: 2017-08-20 | Discharge: 2017-08-20 | Disposition: A | Discharge status: Home / Self Care | Payer: Self-pay | Source: Ambulatory Visit | Attending: Obstetrics and Gynecology | Admitting: Obstetrics and Gynecology

## 2017-08-20 VITALS — Ht 64.0 in

## 2017-08-20 DIAGNOSIS — N6310 Unspecified lump in the right breast, unspecified quadrant: Secondary | ICD-10-CM

## 2017-08-20 DIAGNOSIS — Z01419 Encounter for gynecological examination (general) (routine) without abnormal findings: Secondary | ICD-10-CM

## 2017-08-20 DIAGNOSIS — N6320 Unspecified lump in the left breast, unspecified quadrant: Principal | ICD-10-CM

## 2017-08-20 DIAGNOSIS — N898 Other specified noninflammatory disorders of vagina: Secondary | ICD-10-CM

## 2017-08-20 DIAGNOSIS — N6315 Unspecified lump in the right breast, overlapping quadrants: Secondary | ICD-10-CM

## 2017-08-20 DIAGNOSIS — N644 Mastodynia: Secondary | ICD-10-CM

## 2017-08-20 DIAGNOSIS — N6324 Unspecified lump in the left breast, lower inner quadrant: Secondary | ICD-10-CM

## 2017-08-20 NOTE — Progress Notes (Signed)
Complaints of left breast lump and pain since around October 2017. Patient rates the pain at a 2-3 out of 10.  Pap Smear: Pap smear completed today. [facility] and normal with positive HPV. Per patient has a history of multiple abnormal Pap smears over the past 12 years with her last abnormal Pap smear being in 2013. 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 Patient stated she thinks she has had a colposcopy completed in the past for follow up for one of her abnormal Pap smears. Last Pap smear and previous Pap smear result from 07/19/2007 are in EPIC.  Physical exam: Breasts Breasts symmetrical. No skin abnormalities bilateral breasts. No nipple retraction bilateral breasts. No nipple discharge bilateral breasts. No lymphadenopathy. Palpated a mobile lump within the left breast at 8 o'clock 3 cm from the nipple. Palpated a mobile lump within the right breast at 3 o'clock next to areola. Complaints of tenderness when palpated left breast lump. [facility] diagnostic mamm[facility] and possible bilateral breast ultrasound. Appointment scheduled for Thursday, August 20, 2017 at 1120.   Pelvic/Bimanual   Ext Genitalia No lesions, no swelling and no discharge observed on external genitalia.         Vagina Vagina pink and normal texture. No lesions and copious yellowish frothy discharge observed in vagina. Wet prep completed.         Cervix Cervix is present. Cervix pink and of normal texture. Yellowish colored frothy discharge observed on cervix.    Uterus Uterus is present and palpable. Uterus in normal position and normal size.        Adnexae Bilateral ovaries present and palpable. No tenderness on palpation.         Rectovaginal No rectal exam completed today since patient had no rectal complaints. No skin abnormalities observed on exam.    Smoking History: Patient is a current smoker. Discussed smoking cessation with  patient. Referred [facility] to the [facility] and gave resources to free smoking cessation classes at [facility].  Patient Navigation: Patient education provided. Access to services provided for patient through [facility] program.   Breast and Cervical Cancer Risk Assessment: Patient has a family history of her maternal great aunt having breast cancer and mother with ovarian and uterine cancer. Patient has no known genetic mutations or history of radiation treatment to the chest before age 37. Patient has history of cervical dysplasia. Patient has no history of being immunocompromised or DES exposure in-utero.

## 2017-08-20 NOTE — Patient Instructions (Signed)
Explained breast self awareness with Alejandra LegatoErica Danielle Garza. Let patient know that if today's Pap smear is normal that her next Pap smear will be due in one year due to her history of abnormal Pap smears. [facility] for a diagnostic mammogram and possible bilateral breast ultrasound. Appointment scheduled for Thursday, August 20, 2017 at 1120. Let patient know will follow up with her within the next week with results of Pap smear and wet prep by phone. Discussed smoking cessation with patient. Referred [facility] to the [facility] and gave resources to free [facility] cessation classes [facility]. Alejandra Garza verbalized understanding.  Alejandra Garza, Alejandra Maserhristine Poll, RN 1:35 PM

## 2017-08-21 LAB — CYTOLOGY - PAP
Bacterial vaginitis: POSITIVE — AB
CANDIDA VAGINITIS: NEGATIVE
Diagnosis: NEGATIVE
HPV (WINDOPATH): NOT DETECTED
Trichomonas: POSITIVE — AB

## 2017-08-24 ENCOUNTER — Other Ambulatory Visit (HOSPITAL_COMMUNITY): Payer: Self-pay | Admitting: *Deleted

## 2017-08-24 ENCOUNTER — Telehealth (HOSPITAL_COMMUNITY): Payer: Self-pay | Admitting: *Deleted

## 2017-08-24 MED ORDER — METRONIDAZOLE 500 MG PO TABS: 500.0000 mg | ORAL_TABLET | Freq: Two times a day (BID) | ORAL | 0 refills | Status: AC

## 2017-08-24 NOTE — Telephone Encounter (Signed)
Telephoned patient at home number and advised patient of negative pap smear results. HPV was negative. Next pap smear due in one year. Advised patient pap smear did show bacterial vaginosis and trichomonas. Medication was called to pharmacy. Advised patient no alcohol while taking medication, no sexual contact until completed medication,and partner would also need to be treated. Patient voiced understanding.

## 2017-08-25 ENCOUNTER — Encounter (HOSPITAL_COMMUNITY): Payer: Self-pay | Admitting: *Deleted

## 2017-09-12 ENCOUNTER — Encounter (HOSPITAL_BASED_OUTPATIENT_CLINIC_OR_DEPARTMENT_OTHER): Payer: Self-pay | Admitting: Emergency Medicine

## 2017-09-12 ENCOUNTER — Emergency Department (HOSPITAL_BASED_OUTPATIENT_CLINIC_OR_DEPARTMENT_OTHER)
Admission: EM | Admit: 2017-09-12 | Discharge: 2017-09-12 | Disposition: A | Discharge status: Home / Self Care | Payer: No Typology Code available for payment source | Attending: Emergency Medicine | Admitting: Emergency Medicine

## 2017-09-12 ENCOUNTER — Other Ambulatory Visit: Payer: Self-pay

## 2017-09-12 DIAGNOSIS — N898 Other specified noninflammatory disorders of vagina: Secondary | ICD-10-CM | POA: Insufficient documentation

## 2017-09-12 DIAGNOSIS — N390 Urinary tract infection, site not specified: Secondary | ICD-10-CM

## 2017-09-12 DIAGNOSIS — B373 Candidiasis of vulva and vagina: Secondary | ICD-10-CM

## 2017-09-12 DIAGNOSIS — R109 Unspecified abdominal pain: Secondary | ICD-10-CM | POA: Insufficient documentation

## 2017-09-12 DIAGNOSIS — B3731 Acute candidiasis of vulva and vagina: Secondary | ICD-10-CM

## 2017-09-12 DIAGNOSIS — F17210 Nicotine dependence, cigarettes, uncomplicated: Secondary | ICD-10-CM | POA: Insufficient documentation

## 2017-09-12 LAB — URINALYSIS, ROUTINE W REFLEX MICROSCOPIC
Bilirubin Urine: NEGATIVE
GLUCOSE, UA: NEGATIVE mg/dL
Ketones, ur: 15 mg/dL — AB
Nitrite: NEGATIVE
Protein, ur: NEGATIVE mg/dL
SPECIFIC GRAVITY, URINE: 1.02 (ref 1.005–1.030)
pH: 5.5 (ref 5.0–8.0)

## 2017-09-12 LAB — URINALYSIS, MICROSCOPIC (REFLEX)

## 2017-09-12 LAB — WET PREP, GENITAL
Sperm: NONE SEEN
Trich, Wet Prep: NONE SEEN

## 2017-09-12 LAB — PREGNANCY, URINE: Preg Test, Ur: NEGATIVE

## 2017-09-12 MED ORDER — ACETAMINOPHEN 500 MG PO TABS
1000.0000 mg | ORAL_TABLET | Freq: Once | ORAL | Status: AC
  Administered 2017-09-12: 1000 mg via ORAL
  Filled 2017-09-12: qty 2

## 2017-09-12 MED ORDER — CEPHALEXIN 500 MG PO CAPS: 500.0000 mg | ORAL_CAPSULE | Freq: Four times a day (QID) | ORAL | 0 refills | Status: AC

## 2017-09-12 MED ORDER — METRONIDAZOLE 500 MG PO TABS: 500.0000 mg | ORAL_TABLET | Freq: Two times a day (BID) | ORAL | 0 refills | Status: AC

## 2017-09-12 MED ORDER — FLUCONAZOLE 100 MG PO TABS
200.0000 mg | ORAL_TABLET | Freq: Once | ORAL | Status: AC
  Administered 2017-09-12: 200 mg via ORAL
  Filled 2017-09-12: qty 2

## 2017-09-12 MED ORDER — TRAMADOL HCL 50 MG PO TABS
50.0000 mg | ORAL_TABLET | Freq: Once | ORAL | Status: AC
  Administered 2017-09-12: 50 mg via ORAL
  Filled 2017-09-12: qty 1

## 2017-09-12 NOTE — Discharge Instructions (Addendum)
It was our pleasure to provide your ER care today - we hope that you feel better.  Take antibiotics (flagyl and keflex) as prescribed.   Take acetaminophen and/or ibuprofen as need for pain.   Follow up with primary care doctor in 1 week for recheck if symptoms fail to improve/resolve.  Return to ER if  worse, new symptoms, high fevers, severe pain, persistent vomiting, other concern.  You were given pain medication in the ER - no driving for the next 4 hours.

## 2017-09-12 NOTE — ED Provider Notes (Addendum)
[facility] EMERGENCY DEPARTMENT Provider Note   CSN: [id number] Arrival date & time: 09/12/17  1359     History   Chief Complaint Chief Complaint  Patient presents with  . Abdominal Pain  . Vaginal Itching    HPI Alejandra Garza is a 37 y.o. female.  Patient c/o vaginal irritation/itching for past several days, was recently on abx and symptoms began afterwards. Symptoms moderate, persistent, without specific exacerbating or alleviating factors. Mild whitish discharge. No pelvic pain. Also c/o right upper abd, right flank pattern laterally. Denies injury or strain to area. No dysuria or hematuria. Pain constant, dull, mild, not pleuritic, worse w some movements/position changes. No fevers. No nv. Having normal bms.   The history is provided by the patient.  Abdominal Pain   Pertinent negatives include fever, diarrhea, vomiting, constipation, dysuria, hematuria and headaches.  Vaginal Itching  Associated symptoms include abdominal pain. Pertinent negatives include no chest pain, no headaches and no shortness of breath.    Past Medical History:  Diagnosis Date  . Anemia     Patient Active Problem List   Diagnosis Date Noted  . PRURITUS 11/16/2008  . SYNCOPE 11/16/2008  . SYMPTOM, HEADACHE 10/12/2006  . TOBACCO DEPENDENCE 07/23/2006  . RHINITIS, ALLERGIC 07/23/2006    History reviewed. No pertinent surgical history.   OB History    Gravida  1   Para  1   Term  1   Preterm      AB      Living  1     SAB      TAB      Ectopic      Multiple      Live Births  1            Home Medications    Prior to Admission medications   Medication Sig Start Date End Date Taking? Authorizing Provider  ibuprofen (ADVIL,MOTRIN) 200 MG tablet Take 400 mg by mouth every 6 (six) hours as needed for headache, mild pain or moderate pain.    [provider]  metroNIDAZOLE (FLAGYL) 500 MG tablet Take 1 tablet (500 mg total) by mouth 2 (two)  times daily. Patient not taking: Reported on 08/20/2017 04/07/16   Constant, Peggy, MD  metroNIDAZOLE (FLAGYL) 500 MG tablet Take 1 tablet (500 mg total) by mouth 2 (two) times daily. 08/24/17   Constant, Peggy, MD  naproxen (NAPROSYN) 250 MG tablet Take 1 tablet (250 mg total) by mouth 2 (two) times daily with a meal. 01/08/17   Everlene Farrieransie, William, PA-C  ondansetron (ZOFRAN ODT) 4 MG disintegrating tablet Take 1 tablet (4 mg total) by mouth every 8 (eight) hours as needed for nausea or vomiting. Patient not taking: Reported on 08/20/2017 01/08/17   Everlene Farrieransie, William, PA-C    Family History Family History  Problem Relation Age of Onset  . Cancer Mother        uterine  . Lupus Mother   . Cancer Maternal Grandmother        bone    Social History Social History   Tobacco Use  . Smoking status: Current Every Day Smoker    Packs/day: 1.00    Types: Cigarettes  . Smokeless tobacco: Never Used  Substance Use Topics  . Alcohol use: Yes    Comment: weekends  . Drug use: No     Allergies   Patient has no known allergies.   Review of Systems Review of Systems  Constitutional: Negative for fever.  HENT:  Negative for sore throat.   Eyes: Negative for redness.  Respiratory: Negative for shortness of breath.   Cardiovascular: Negative for chest pain.  Gastrointestinal: Positive for abdominal pain. Negative for constipation, diarrhea and vomiting.  Endocrine: Negative for polyuria.  Genitourinary: Positive for vaginal discharge. Negative for dysuria and hematuria.  Musculoskeletal: Negative for back pain.  Skin: Negative for rash.  Neurological: Negative for headaches.  Hematological: Does not bruise/bleed easily.  Psychiatric/Behavioral: Negative for confusion.     Physical Exam Updated Vital Signs BP (!) 145/85 (BP Location: Left Arm)   Pulse 98   Temp 98.6 F (37 C) (Oral)   Resp 16   Ht 1.626 m (5\' 4" )   Wt 50.8 kg (112 lb)   LMP 08/24/2017   SpO2 100%   BMI 19.22 kg/m    Physical Exam  Constitutional: She appears well-developed and well-nourished. No distress.  Eyes: Conjunctivae are normal. No scleral icterus.  Neck: Neck supple. No tracheal deviation present.  Cardiovascular: Normal rate, regular rhythm, normal heart sounds and intact distal pulses. Exam reveals no gallop and no friction rub.  No murmur heard. Pulmonary/Chest: Effort normal and breath sounds normal. No respiratory distress.  Abdominal: Soft. Normal appearance and bowel sounds are normal. She exhibits no distension. There is no tenderness.  Genitourinary:  Genitourinary Comments: No cva tenderness. Moderate whitish discharge. +malodor. Cervix closed. No cervical motion tenderness or adnexal tenderness.   Musculoskeletal: She exhibits no edema or tenderness.  Neurological: She is alert.  Skin: Skin is warm and dry. No rash noted. She is not diaphoretic.  Psychiatric: She has a normal mood and affect.  Nursing note and vitals reviewed.    ED Treatments / Results  Labs (all labs ordered are listed, but only abnormal results are displayed) Results for orders placed or performed during the hospital encounter of 09/12/17  Wet prep, genital  Result Value Ref Range   Yeast Wet Prep HPF POC PRESENT (A) NONE SEEN   Trich, Wet Prep NONE SEEN NONE SEEN   Clue Cells Wet Prep HPF POC PRESENT (A) NONE SEEN   WBC, Wet Prep HPF POC MANY (A) NONE SEEN   Sperm NONE SEEN   Urinalysis, Routine w reflex microscopic  Result Value Ref Range   Color, Urine YELLOW YELLOW   APPearance HAZY (A) CLEAR   Specific Gravity, Urine 1.020 1.005 - 1.030   pH 5.5 5.0 - 8.0   Glucose, UA NEGATIVE NEGATIVE mg/dL   Hgb urine dipstick MODERATE (A) NEGATIVE   Bilirubin Urine NEGATIVE NEGATIVE   Ketones, ur 15 (A) NEGATIVE mg/dL   Protein, ur NEGATIVE NEGATIVE mg/dL   Nitrite NEGATIVE NEGATIVE   Leukocytes, UA SMALL (A) NEGATIVE  Pregnancy, urine  Result Value Ref Range   Preg Test, Ur NEGATIVE NEGATIVE   Urinalysis, Microscopic (reflex)  Result Value Ref Range   RBC / HPF 6-30 0 - 5 RBC/hpf   WBC, UA 6-30 0 - 5 WBC/hpf   Bacteria, UA MANY (A) NONE SEEN   Squamous Epithelial / LPF 0-5 (A) NONE SEEN     EKG None  Radiology No results found.  Procedures Procedures (including critical care time)  Medications Ordered in ED Medications - No data to display   Initial Impression / Assessment and Plan / ED Course  I have reviewed the triage vital signs and the nursing notes.  Pertinent labs & imaging results that were available during my care of the patient were reviewed by me and considered in my medical  decision making (see chart for details).  Pt has ride, does not have to drive. No meds today for pain.   Acetaminophen po, and ultram po for symptom relief.  Pelvic exam.  Reviewed nursing notes and prior charts for additional history.   Suspect uti on labs.   Keflex po.   Wet prep pos yeast and clue cells.   Fluconazole po.  rx flagyl.  abd soft nt.  Patient appears stable for d/c.     Final Clinical Impressions(s) / ED Diagnoses   Final diagnoses:  None    ED Discharge Orders    None         Cathren Laine, MD 09/12/17 [phone]

## 2017-09-12 NOTE — ED Triage Notes (Signed)
LUQ pain x 2 days. Taking naproxen with relief but pain returns. Also c/o vaginal itching. Used monistat without relief.

## 2017-09-14 LAB — GC/CHLAMYDIA PROBE AMP (~~LOC~~) NOT AT ARMC
Chlamydia: NEGATIVE
Neisseria Gonorrhea: NEGATIVE

## 2017-09-15 ENCOUNTER — Emergency Department (HOSPITAL_COMMUNITY): Payer: Self-pay

## 2017-09-15 ENCOUNTER — Emergency Department (HOSPITAL_COMMUNITY)
Admission: EM | Admit: 2017-09-15 | Discharge: 2017-09-15 | Disposition: A | Discharge status: Home / Self Care | Payer: Self-pay | Attending: Emergency Medicine | Admitting: Emergency Medicine

## 2017-09-15 ENCOUNTER — Encounter (HOSPITAL_COMMUNITY): Payer: Self-pay | Admitting: *Deleted

## 2017-09-15 ENCOUNTER — Other Ambulatory Visit: Payer: Self-pay

## 2017-09-15 DIAGNOSIS — Z79899 Other long term (current) drug therapy: Secondary | ICD-10-CM | POA: Insufficient documentation

## 2017-09-15 DIAGNOSIS — F17210 Nicotine dependence, cigarettes, uncomplicated: Secondary | ICD-10-CM | POA: Insufficient documentation

## 2017-09-15 DIAGNOSIS — N12 Tubulo-interstitial nephritis, not specified as acute or chronic: Secondary | ICD-10-CM

## 2017-09-15 DIAGNOSIS — N10 Acute tubulo-interstitial nephritis: Secondary | ICD-10-CM | POA: Insufficient documentation

## 2017-09-15 LAB — URINALYSIS, ROUTINE W REFLEX MICROSCOPIC
BACTERIA UA: NONE SEEN
Bilirubin Urine: NEGATIVE
GLUCOSE, UA: NEGATIVE mg/dL
Ketones, ur: NEGATIVE mg/dL
Nitrite: NEGATIVE
PROTEIN: NEGATIVE mg/dL
Specific Gravity, Urine: 1.021 (ref 1.005–1.030)
pH: 6 (ref 5.0–8.0)

## 2017-09-15 LAB — COMPREHENSIVE METABOLIC PANEL
ALT: 21 U/L (ref 14–54)
AST: 28 U/L (ref 15–41)
Albumin: 3 g/dL — ABNORMAL LOW (ref 3.5–5.0)
Alkaline Phosphatase: 51 U/L (ref 38–126)
Anion gap: 8 (ref 5–15)
BUN: 13 mg/dL (ref 6–20)
CO2: 25 mmol/L (ref 22–32)
Calcium: 9 mg/dL (ref 8.9–10.3)
Chloride: 104 mmol/L (ref 101–111)
Creatinine, Ser: 0.86 mg/dL (ref 0.44–1.00)
GFR calc Af Amer: >60 mL/min (ref >60)
GFR calc non Af Amer: >60 mL/min (ref >60)
Glucose, Bld: 103 mg/dL — ABNORMAL HIGH (ref 65–99)
Potassium: 4 mmol/L (ref 3.5–5.1)
Sodium: 137 mmol/L (ref 135–145)
Total Bilirubin: 0.2 mg/dL — ABNORMAL LOW (ref 0.3–1.2)
Total Protein: 6.3 g/dL — ABNORMAL LOW (ref 6.5–8.1)

## 2017-09-15 LAB — CBC
HEMATOCRIT: 35.2 % — AB (ref 36.0–46.0)
Hemoglobin: 11.9 g/dL — ABNORMAL LOW (ref 12.0–15.0)
MCH: 32.6 pg (ref 26.0–34.0)
MCHC: 33.8 g/dL (ref 30.0–36.0)
MCV: 96.4 fL (ref 78.0–100.0)
Platelets: 255 10*3/uL (ref 150–400)
RBC: 3.65 MIL/uL — AB (ref 3.87–5.11)
RDW: 12.8 % (ref 11.5–15.5)
WBC: 6.6 10*3/uL (ref 4.0–10.5)

## 2017-09-15 LAB — POC URINE PREG, ED: PREG TEST UR: NEGATIVE

## 2017-09-15 MED ORDER — SODIUM CHLORIDE 0.9 % IV BOLUS
1000.0000 mL | Freq: Once | INTRAVENOUS | Status: AC
  Administered 2017-09-15: 1000 mL via INTRAVENOUS

## 2017-09-15 MED ORDER — CIPROFLOXACIN IN D5W 400 MG/200ML IV SOLN
400.0000 mg | Freq: Once | INTRAVENOUS | Status: AC
  Administered 2017-09-15: 400 mg via INTRAVENOUS
  Filled 2017-09-15: qty 200

## 2017-09-15 MED ORDER — OXYCODONE-ACETAMINOPHEN 5-325 MG PO TABS
1.0000 | ORAL_TABLET | Freq: Once | ORAL | Status: AC
  Administered 2017-09-15: 1 via ORAL
  Filled 2017-09-15: qty 1

## 2017-09-15 MED ORDER — CIPROFLOXACIN HCL 500 MG PO TABS: 500.0000 mg | ORAL_TABLET | Freq: Two times a day (BID) | ORAL | 0 refills | Status: AC

## 2017-09-15 NOTE — ED Notes (Signed)
Pt tearful upon entering room, orders for pain medication given

## 2017-09-15 NOTE — ED Provider Notes (Signed)
[facility] EMERGENCY DEPARTMENT Provider Note   CSN: [id number] Arrival date & time: 09/15/17  4098     History   Chief Complaint Chief Complaint  Patient presents with  . Back Pain    HPI Alejandra Garza is a 37 y.o. female.  HPI Patient presents to the emergency department with left-sided flank and back pain that is gotten worse over the last 2 days.  The patient states that she was seen several days ago for a UTI and placed on antibiotics.  The patient states that she seems to have better relief of the pain with urination but continues to have the flank pain.  Patient states that nothing seems to make the condition better or worse.  The patient denies chest pain, shortness of breath, headache,blurred vision, neck pain, fever, cough, weakness, numbness, dizziness, anorexia, edema, nausea, vomiting, diarrhea, rash, back pain, hematemesis, bloody stool, near syncope, or syncope. Past Medical History:  Diagnosis Date  . Anemia     Patient Active Problem List   Diagnosis Date Noted  . PRURITUS 11/16/2008  . SYNCOPE 11/16/2008  . SYMPTOM, HEADACHE 10/12/2006  . TOBACCO DEPENDENCE 07/23/2006  . RHINITIS, ALLERGIC 07/23/2006    History reviewed. No pertinent surgical history.   OB History    Gravida  1   Para  1   Term  1   Preterm      AB      Living  1     SAB      TAB      Ectopic      Multiple      Live Births  1            Home Medications    Prior to Admission medications   Medication Sig Start Date End Date Taking? Authorizing Provider  cephALEXin (KEFLEX) 500 MG capsule Take 1 capsule (500 mg total) by mouth 4 (four) times daily. 09/12/17   Cathren Laine, MD  ciprofloxacin (CIPRO) 500 MG tablet Take 1 tablet (500 mg total) by mouth 2 (two) times daily. 09/15/17   Kendan Cornforth, Cristal Deer, PA-C  ibuprofen (ADVIL,MOTRIN) 200 MG tablet Take 400 mg by mouth every 6 (six) hours as needed for headache, mild pain or moderate  pain.    [provider]  metroNIDAZOLE (FLAGYL) 500 MG tablet Take 1 tablet (500 mg total) by mouth 2 (two) times daily. Patient not taking: Reported on 08/20/2017 04/07/16   Constant, Peggy, MD  metroNIDAZOLE (FLAGYL) 500 MG tablet Take 1 tablet (500 mg total) by mouth 2 (two) times daily. 08/24/17   Constant, Peggy, MD  metroNIDAZOLE (FLAGYL) 500 MG tablet Take 1 tablet (500 mg total) by mouth 2 (two) times daily. 09/12/17   Cathren Laine, MD  naproxen (NAPROSYN) 250 MG tablet Take 1 tablet (250 mg total) by mouth 2 (two) times daily with a meal. 01/08/17   Everlene Farrier, PA-C  ondansetron (ZOFRAN ODT) 4 MG disintegrating tablet Take 1 tablet (4 mg total) by mouth every 8 (eight) hours as needed for nausea or vomiting. Patient not taking: Reported on 08/20/2017 01/08/17   Everlene Farrier, PA-C    Family History Family History  Problem Relation Age of Onset  . Cancer Mother        uterine  . Lupus Mother   . Cancer Maternal Grandmother        bone    Social History Social History   Tobacco Use  . Smoking status: Current Every Day Smoker  Packs/day: 1.00    Types: Cigarettes  . Smokeless tobacco: Never Used  Substance Use Topics  . Alcohol use: Yes    Comment: weekends  . Drug use: No     Allergies   Patient has no known allergies.   Review of Systems Review of Systems All other systems negative except as documented in the HPI. All pertinent positives and negatives as reviewed in the HPI.  Physical Exam Updated Vital Signs BP (!) 112/58 (BP Location: Right Arm)   Pulse 64   Temp 98.5 F (36.9 C) (Oral)   Resp 20   Ht 5\' 4"  (1.626 m)   Wt 50.8 kg (112 lb)   LMP 08/24/2017   SpO2 100%   BMI 19.22 kg/m   Physical Exam  Constitutional: She is oriented to person, place, and time. She appears well-developed and well-nourished. No distress.  HENT:  Head: Normocephalic and atraumatic.  Mouth/Throat: Oropharynx is clear and moist.  Eyes: Pupils are equal,  round, and reactive to light.  Neck: Normal range of motion. Neck supple.  Cardiovascular: Normal rate, regular rhythm and normal heart sounds. Exam reveals no gallop and no friction rub.  No murmur heard. Pulmonary/Chest: Effort normal and breath sounds normal. No respiratory distress. She has no wheezes.  Abdominal: Soft. Bowel sounds are normal. She exhibits no distension and no mass. There is tenderness. There is no guarding.  Neurological: She is alert and oriented to person, place, and time. She exhibits normal muscle tone. Coordination normal.  Skin: Skin is warm and dry. Capillary refill takes less than 2 seconds. No rash noted. No erythema.  Psychiatric: She has a normal mood and affect. Her behavior is normal.  Nursing note and vitals reviewed.    ED Treatments / Results  Labs (all labs ordered are listed, but only abnormal results are displayed) Labs Reviewed  CBC - Abnormal; Notable for the following components:      Result Value   RBC 3.65 (*)    Hemoglobin 11.9 (*)    HCT 35.2 (*)    All other components within normal limits  URINALYSIS, ROUTINE W REFLEX MICROSCOPIC - Abnormal; Notable for the following components:   Hgb urine dipstick SMALL (*)    Leukocytes, UA TRACE (*)    All other components within normal limits  COMPREHENSIVE METABOLIC PANEL - Abnormal; Notable for the following components:   Glucose, Bld 103 (*)    Total Protein 6.3 (*)    Albumin 3.0 (*)    Total Bilirubin 0.2 (*)    All other components within normal limits  POC URINE PREG, ED    EKG None  Radiology Ct Renal Stone Study  Result Date: 09/15/2017 CLINICAL DATA:  Left flank pain for 4 days, concern for nephrolithiasis EXAM: CT ABDOMEN AND PELVIS WITHOUT CONTRAST TECHNIQUE: Multidetector CT imaging of the abdomen and pelvis was performed following the standard protocol without IV contrast. COMPARISON:  01/08/2017 FINDINGS: Lower chest: Minor dependent basilar atelectasis. Normal heart  size. No pleural effusion. Hepatobiliary: No focal abnormality or ductal dilatation within the limits of noncontrast imaging. Gallbladder biliary system unremarkable. Pancreas: No ductal dilatation or surrounding inflammatory process. Spleen: Normal in size without focal abnormality. Adrenals/Urinary Tract: Normal adrenal glands. No renal obstruction or hydronephrosis. Left kidney demonstrates minimal strandy edema about the renal pelvis and proximal ureter, images 24 through 28. No visualized obstructing ureteral calculus. This may be secondary to recent stone passage versus ascending urinary tract infection or pyelonephritis. Right kidney and ureter demonstrate  no acute finding. Stomach/Bowel: Stomach is within normal limits. Appendix appears normal. No evidence of bowel wall thickening, distention, or inflammatory changes. Vascular/Lymphatic: No significant vascular findings are present. No enlarged abdominal or pelvic lymph nodes. Reproductive: Uterus is retroverted but normal in size. No adnexal abnormality. No pelvic free fluid or fluid collection. Other: No abdominal wall hernia or abnormality. No abdominopelvic ascites. Musculoskeletal: No acute or significant osseous findings. IMPRESSION: Trace strandy edema about the left renal pelvis and left proximal ureter suspicious for recent stone passage or ascending urinary tract infection/pyelonephritis. No other acute finding by noncontrast CT. Electronically Signed   By: Judie Petit.  Shick M.D.   On: 09/15/2017 10:09    Procedures Procedures (including critical care time)  Medications Ordered in ED Medications  oxyCODONE-acetaminophen (PERCOCET/ROXICET) 5-325 MG per tablet 1 tablet (1 tablet Oral Given 09/15/17 0924)  ciprofloxacin (CIPRO) IVPB 400 mg (0 mg Intravenous Stopped 09/15/17 1248)  sodium chloride 0.9 % bolus 1,000 mL (0 mLs Intravenous Stopped 09/15/17 1248)     Initial Impression / Assessment and Plan / ED Course  I have reviewed the triage vital  signs and the nursing notes.  Pertinent labs & imaging results that were available during my care of the patient were reviewed by me and considered in my medical decision making (see chart for details).  Clinical Course as of Sep 16 1610  Tue Sep 15, 2017  0857 Urinalysis, Routine w reflex microscopic [LB]  0857 Urinalysis, Routine w reflex microscopic [LB]    Clinical Course User Index [LB] Mervin Kung, Student-PA    Patient will be treated for pyelonephritis in the early stages that she has not had any vomiting or fluid loss.  The patient is given IV fluids along with IV antibiotics.  The patient is feeling better following pain control in the fluids.  The patient is advised the plan and all questions were answered.  She was able to eat and drink without difficulties here in the emergency department.  Advised to follow-up with her primary doctor told return here as needed for any worsening in her condition.  Final Clinical Impressions(s) / ED Diagnoses   Final diagnoses:  Pyelonephritis    ED Discharge Orders        Ordered    ciprofloxacin (CIPRO) 500 MG tablet  2 times daily     09/15/17 [id number], PA-C 09/15/17 1614    Gwyneth Sprout, MD 09/15/17 2101

## 2017-09-15 NOTE — Discharge Instructions (Addendum)
Return here as needed.  Increase your fluid intake.  Follow-up with a primary doctor or urgent care.

## 2017-09-15 NOTE — ED Notes (Signed)
Spoke with lab, they have tube to add on CMP test, pt updated

## 2017-09-15 NOTE — ED Triage Notes (Signed)
C/o back pain states [facility]. With uti however not getting better

## 2018-02-22 ENCOUNTER — Inpatient Hospital Stay: Admission: RE | Admit: 2018-02-22 | Payer: No Typology Code available for payment source | Source: Ambulatory Visit

## 2018-12-25 HISTORY — PX: BREAST BIOPSY: SHX20

## 2019-01-12 ENCOUNTER — Other Ambulatory Visit: Payer: Self-pay | Admitting: *Deleted

## 2019-01-12 DIAGNOSIS — N6320 Unspecified lump in the left breast, unspecified quadrant: Secondary | ICD-10-CM

## 2019-01-18 ENCOUNTER — Ambulatory Visit
Admission: RE | Admit: 2019-01-18 | Discharge: 2019-01-18 | Disposition: A | Discharge status: Home / Self Care | Payer: No Typology Code available for payment source | Source: Ambulatory Visit | Attending: Obstetrics and Gynecology | Admitting: Obstetrics and Gynecology

## 2019-01-18 ENCOUNTER — Encounter (HOSPITAL_COMMUNITY): Payer: Self-pay

## 2019-01-18 ENCOUNTER — Ambulatory Visit (HOSPITAL_COMMUNITY)
Admission: RE | Admit: 2019-01-18 | Discharge: 2019-01-18 | Disposition: A | Discharge status: Home / Self Care | Payer: No Typology Code available for payment source | Source: Ambulatory Visit | Attending: Family Medicine | Admitting: Family Medicine

## 2019-01-18 ENCOUNTER — Other Ambulatory Visit: Payer: Self-pay

## 2019-01-18 ENCOUNTER — Other Ambulatory Visit: Payer: Self-pay | Admitting: Obstetrics and Gynecology

## 2019-01-18 DIAGNOSIS — N6324 Unspecified lump in the left breast, lower inner quadrant: Secondary | ICD-10-CM

## 2019-01-18 DIAGNOSIS — N6320 Unspecified lump in the left breast, unspecified quadrant: Secondary | ICD-10-CM

## 2019-01-18 DIAGNOSIS — Z1239 Encounter for other screening for malignant neoplasm of breast: Secondary | ICD-10-CM

## 2019-01-18 NOTE — Progress Notes (Signed)
Complaints of a left breast lump x 2 years that has increased in size over the past two weeks.  Pap Smear: Pap smear not completed today. Last Pap smear was  08/20/2017 at [facility] and normal with negative HPV. Patients two previous Pap smears 03/27/2016 at [facility] was normal with positive HPV and 07/19/2007 and normal. Per patient has a history of multiple abnormal Pap smears with her last abnormal Pap smear being in 2013. 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 Patient stated she thinks she has had a colposcopy completed in the past for follow up for one of her abnormal Pap smears. Last thee Pap smear results are in EPIC.  Physical exam: Breasts Breasts symmetrical. No skin abnormalities bilateral breasts. No nipple retraction bilateral breasts. No nipple discharge bilateral breasts. No lymphadenopathy.Palpated a lump within the left breast at 8 o'clock 3 cm from the nipple. No lumps palpated within the right breast. No complaints of pain or tenderness on exam.Referred patient to the [facility] for a diagnostic mammogram and left breast ultrasound. Appointment scheduled for Tuesday, August 25, 2020at 1310.      Pelvic/Bimanual No Pap smear completed today since last Pap smear and HPV typing was 08/20/2017. Pap smear not indicated per BCCCP guidelines.   Smoking History: Patientis a current smoker. Discussed smoking cessation with patient. Referred patient to the [facility] Quitline and gave resources to free smoking cessation classes at [facility].  Patient Navigation: Patient education provided. Access to services provided for patient through BCCCP program. .  Breast and Cervical Cancer Risk Assessment: Patient has a family history of her maternal great aunt having breast cancer and mother with ovarian and uterine cancer. Patient has no known genetic mutations or history of radiation treatment to the chest before age 74. Per patient has history of cervical dysplasia. Patient  has no history of being immunocompromised or DES exposure in-utero.  Risk Assessment    Risk Scores      01/18/2019   Last edited by: Armond Hang, LPN   5-year risk: 0.6 %   Lifetime risk: 9.6 %

## 2019-01-18 NOTE — Patient Instructions (Signed)
Explained breast self awareness with Alejandra Garza. Patient did not need a Pap smear today due to last Pap smear and HPV typing was 08/20/2017. Let her know BCCCP will cover Pap smears and HPV typing every 5 years unless has a history of abnormal Pap smears. Referred patient to the [facility] for a diagnostic mammogram and left breast ultrasound. Appointment scheduled for Tuesday, August 25, 2020at 1310. Patient aware of appointment and will be there. Discussed smoking cessation with patient. Referred patient to the [facility] Quitline and gave resources to free smoking cessation classes at [facility]. [name] verbalized understanding.  Burleigh Brockmann, Arvil Chaco, RN 8:58 AM

## 2019-01-20 ENCOUNTER — Ambulatory Visit
Admission: RE | Admit: 2019-01-20 | Discharge: 2019-01-20 | Disposition: A | Discharge status: Home / Self Care | Payer: No Typology Code available for payment source | Source: Ambulatory Visit | Attending: Obstetrics and Gynecology | Admitting: Obstetrics and Gynecology

## 2019-01-20 ENCOUNTER — Other Ambulatory Visit: Payer: Self-pay

## 2019-01-20 DIAGNOSIS — N6320 Unspecified lump in the left breast, unspecified quadrant: Secondary | ICD-10-CM

## 2019-02-07 ENCOUNTER — Encounter: Payer: Self-pay | Admitting: *Deleted

## 2020-05-24 IMAGING — MG MM CLIP PLACEMENT
4 series · 4 of 12 positions shown · non-contrast
Comparison: Previous exam(s).

CLINICAL DATA: Status post ultrasound-guided core needle biopsy of
the recently demonstrated 1.8 cm mass in the 7 position of the left
breast.

EXAM:
DIAGNOSTIC LEFT MAMMOGRAM POST ULTRASOUND BIOPSY

[L ML synth-2D]
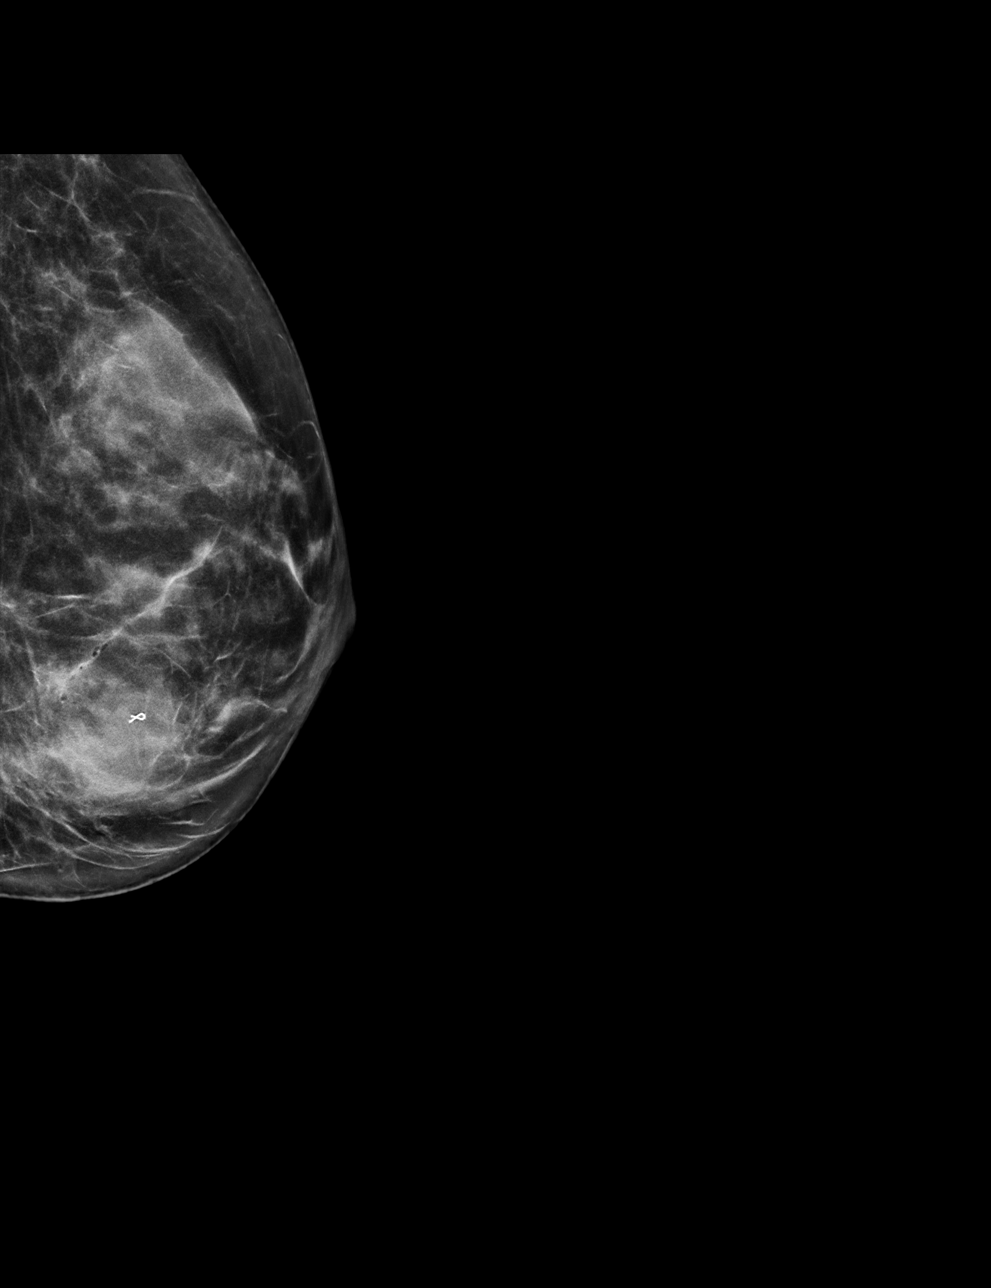

[L CC synth-2D]
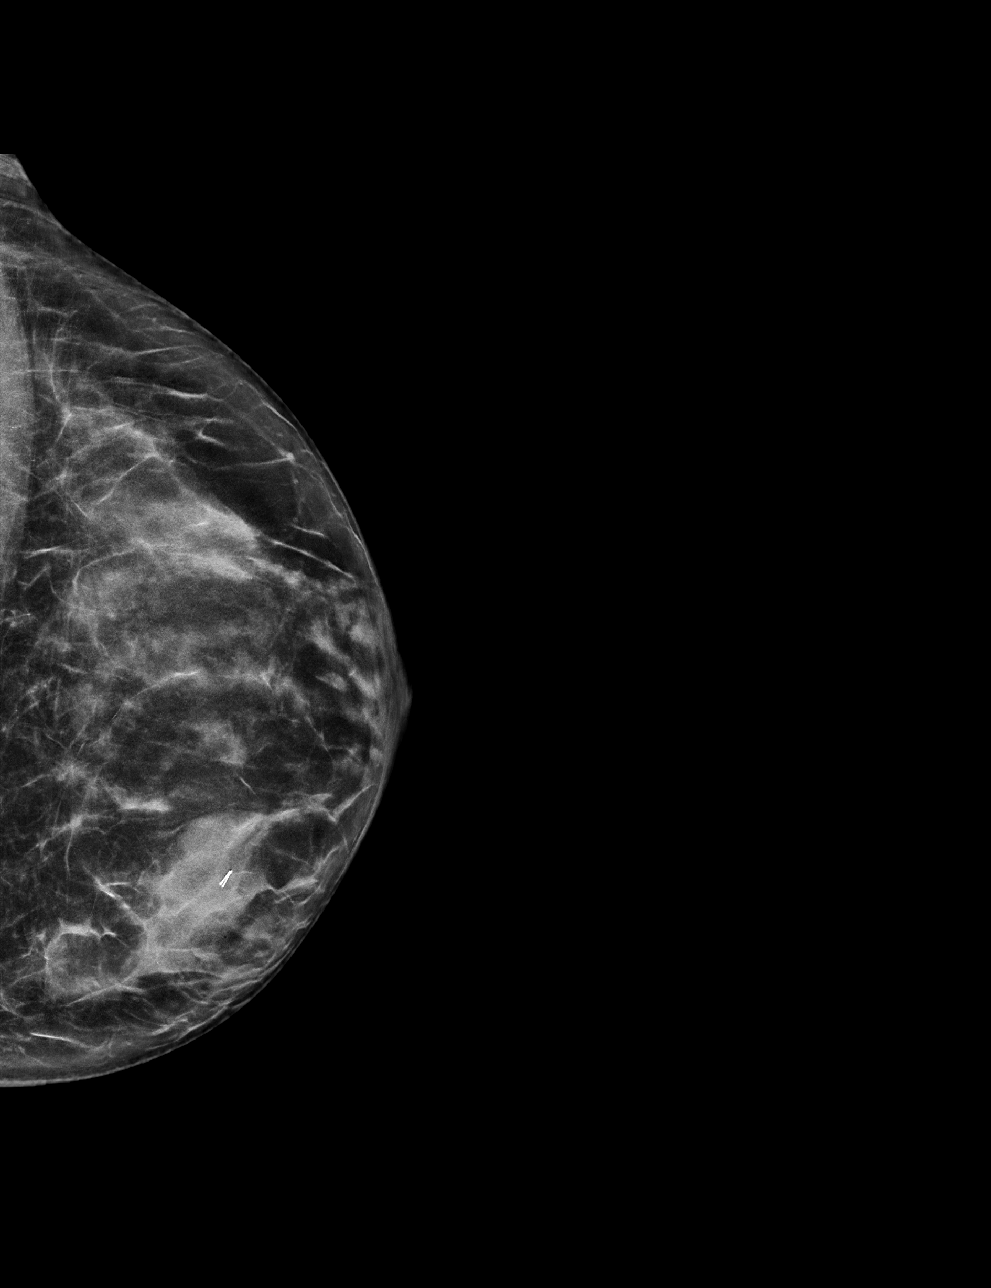

[L ML tomo · tomo slice 29/57.0]
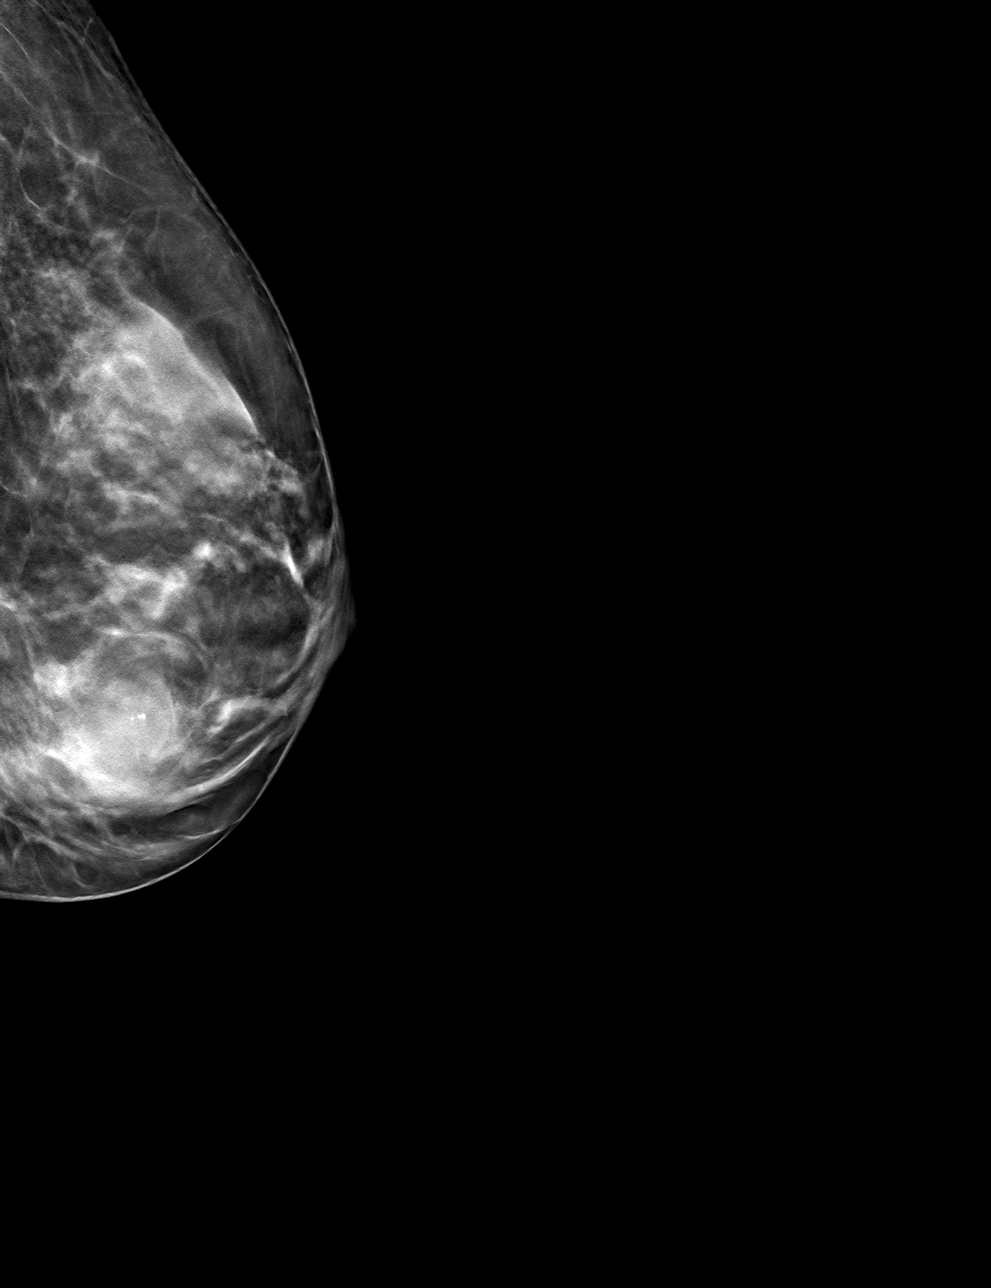

[L CC tomo · tomo slice 29/56.0]
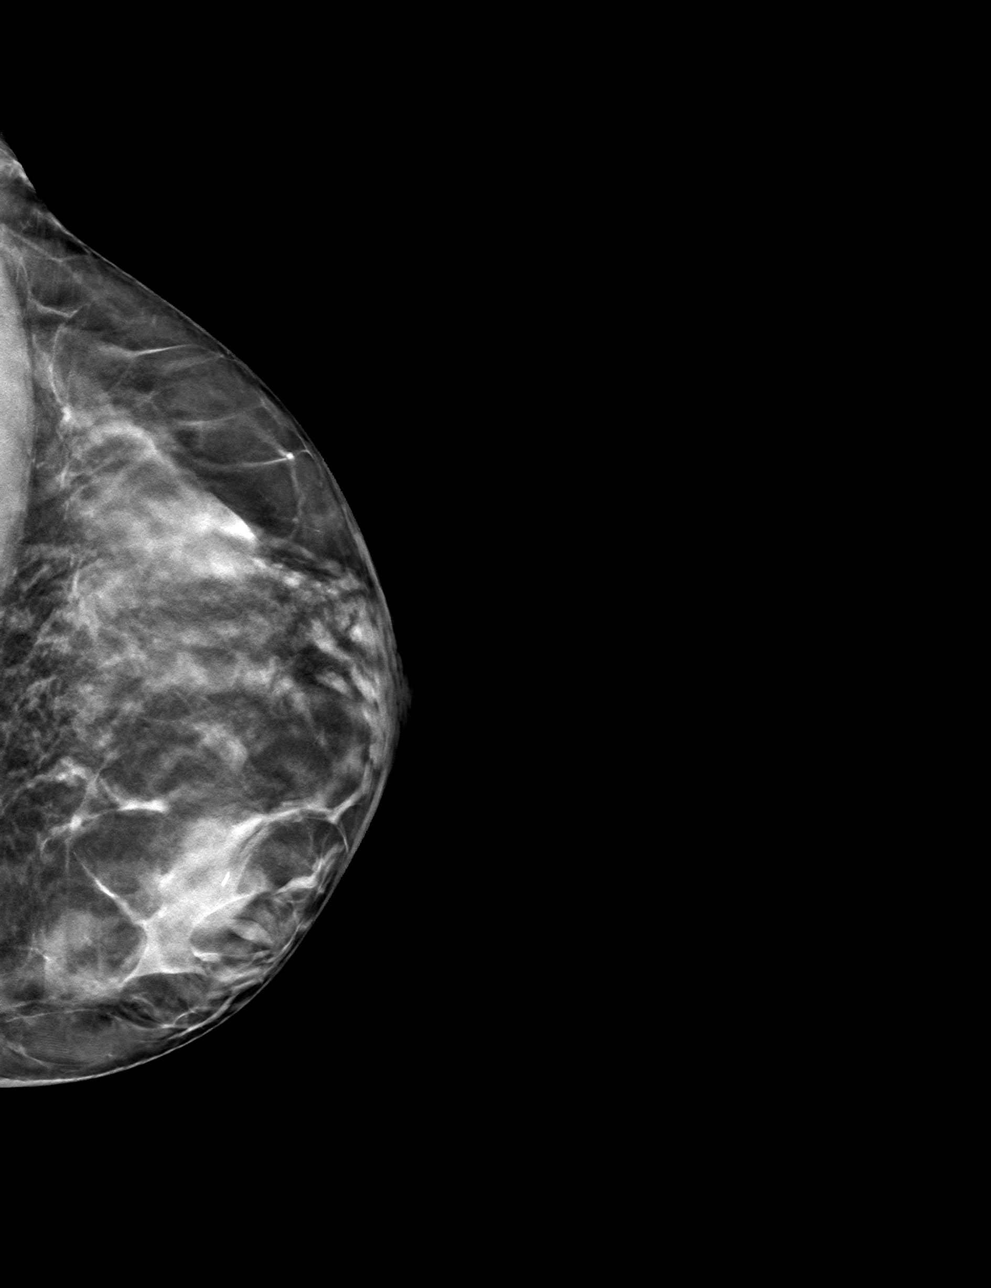

[4 of 12 positions shown; findings below may reference images not displayed]

FINDINGS: Mammographic images were obtained following ultrasound guided biopsy
of the recently demonstrated 1.8 cm mass in the 7 o'clock position
of the left breast. These demonstrate a ribbon shaped biopsy marker
clip within the biopsied mass.
IMPRESSION: Appropriate clip deployment following left breast ultrasound-guided
core needle biopsy.

Final Assessment: Post Procedure Mammograms for Marker Placement

## 2022-05-29 ENCOUNTER — Other Ambulatory Visit: Payer: Self-pay

## 2022-05-29 ENCOUNTER — Encounter (HOSPITAL_BASED_OUTPATIENT_CLINIC_OR_DEPARTMENT_OTHER): Payer: Self-pay | Admitting: Emergency Medicine

## 2022-05-29 ENCOUNTER — Emergency Department (HOSPITAL_BASED_OUTPATIENT_CLINIC_OR_DEPARTMENT_OTHER)
Admission: EM | Admit: 2022-05-29 | Discharge: 2022-05-29 | Disposition: A | Discharge status: Home / Self Care | Payer: Medicaid Other | Attending: Emergency Medicine | Admitting: Emergency Medicine

## 2022-05-29 ENCOUNTER — Other Ambulatory Visit (HOSPITAL_BASED_OUTPATIENT_CLINIC_OR_DEPARTMENT_OTHER): Payer: Self-pay

## 2022-05-29 DIAGNOSIS — R059 Cough, unspecified: Secondary | ICD-10-CM | POA: Diagnosis present

## 2022-05-29 DIAGNOSIS — J3489 Other specified disorders of nose and nasal sinuses: Secondary | ICD-10-CM | POA: Insufficient documentation

## 2022-05-29 DIAGNOSIS — R051 Acute cough: Secondary | ICD-10-CM | POA: Diagnosis not present

## 2022-05-29 DIAGNOSIS — Z20822 Contact with and (suspected) exposure to covid-19: Secondary | ICD-10-CM | POA: Diagnosis not present

## 2022-05-29 DIAGNOSIS — R519 Headache, unspecified: Secondary | ICD-10-CM | POA: Diagnosis not present

## 2022-05-29 DIAGNOSIS — R0789 Other chest pain: Secondary | ICD-10-CM | POA: Diagnosis not present

## 2022-05-29 LAB — RESP PANEL BY RT-PCR (RSV, FLU A&B, COVID)  RVPGX2
Influenza A by PCR: NEGATIVE
Influenza B by PCR: NEGATIVE
Resp Syncytial Virus by PCR: POSITIVE — AB
SARS Coronavirus 2 by RT PCR: NEGATIVE

## 2022-05-29 MED ORDER — BENZONATATE 100 MG PO CAPS
100.0000 mg | ORAL_CAPSULE | Freq: Three times a day (TID) | ORAL | 0 refills | Status: AC
  Filled 2022-05-29: qty 21, 7d supply, fill #0

## 2022-05-29 MED ORDER — ALBUTEROL SULFATE HFA 108 (90 BASE) MCG/ACT IN AERS
2.0000 | INHALATION_SPRAY | Freq: Once | RESPIRATORY_TRACT | Status: AC
  Administered 2022-05-29: 2 via RESPIRATORY_TRACT
  Filled 2022-05-29: qty 6.7

## 2022-05-29 MED ORDER — FEXOFENADINE-PSEUDOEPHED ER 60-120 MG PO TB12: 1.0000 | ORAL_TABLET | Freq: Two times a day (BID) | ORAL | 0 refills | Status: DC

## 2022-05-29 MED ORDER — FEXOFENADINE-PSEUDOEPHED ER 60-120 MG PO TB12
1.0000 | ORAL_TABLET | Freq: Two times a day (BID) | ORAL | 0 refills | Status: AC
  Filled 2022-05-29: qty 30, 15d supply, fill #0

## 2022-05-29 MED ORDER — BENZONATATE 100 MG PO CAPS: 100.0000 mg | ORAL_CAPSULE | Freq: Three times a day (TID) | ORAL | 0 refills | Status: DC

## 2022-05-29 NOTE — ED Triage Notes (Signed)
Patient presents C/O nasal congestion, sinus pressure X several days. Denies sick contacts. Last took Motrin 7AM

## 2022-05-29 NOTE — ED Notes (Signed)
Reviewed discharge instructions and medications with pt. Pt states understanding 

## 2022-05-29 NOTE — ED Provider Notes (Signed)
[facility] Provider Note   CSN: [id number] Arrival date & time: 05/29/22  1217     History Chief Complaint  Patient presents with   Cough    Alejandra Garza is a 42 y.o. female patient who presents to the emergency department today with several day history of sinus pressure, headache, cough, chest tightness. No fever, chills, abdominal pain, nausea, or vomiting.    Cough      Home Medications Prior to Admission medications   Medication Sig Start Date End Date Taking? Authorizing Provider  benzonatate (TESSALON) 100 MG capsule Take 1 capsule (100 mg total) by mouth every 8 (eight) hours. 05/29/22  Yes Jonda Alanis M, PA-C  fexofenadine-pseudoephedrine (ALLEGRA-D) 60-120 MG 12 hr tablet Take 1 tablet by mouth every 12 (twelve) hours. 05/29/22  Yes Raul Del, Javius Sylla M, PA-C  cephALEXin (KEFLEX) 500 MG capsule Take 1 capsule (500 mg total) by mouth 4 (four) times daily. Patient not taking: Reported on 01/18/2019 09/12/17   Lajean Saver, MD  ciprofloxacin (CIPRO) 500 MG tablet Take 1 tablet (500 mg total) by mouth 2 (two) times daily. Patient not taking: Reported on 01/18/2019 09/15/17   Dalia Heading, PA-C  ibuprofen (ADVIL,MOTRIN) 200 MG tablet Take 400 mg by mouth every 6 (six) hours as needed for headache, mild pain or moderate pain.    [provider]  metroNIDAZOLE (FLAGYL) 500 MG tablet Take 1 tablet (500 mg total) by mouth 2 (two) times daily. Patient not taking: Reported on 08/20/2017 04/07/16   Constant, Peggy, MD  metroNIDAZOLE (FLAGYL) 500 MG tablet Take 1 tablet (500 mg total) by mouth 2 (two) times daily. Patient not taking: Reported on 01/18/2019 08/24/17   Constant, Peggy, MD  metroNIDAZOLE (FLAGYL) 500 MG tablet Take 1 tablet (500 mg total) by mouth 2 (two) times daily. Patient not taking: Reported on 01/18/2019 09/12/17   Lajean Saver, MD  naproxen (NAPROSYN) 250 MG tablet Take 1 tablet (250 mg total) by mouth 2 (two) times  daily with a meal. Patient not taking: Reported on 01/18/2019 01/08/17   Waynetta Pean, PA-C  ondansetron (ZOFRAN ODT) 4 MG disintegrating tablet Take 1 tablet (4 mg total) by mouth every 8 (eight) hours as needed for nausea or vomiting. Patient not taking: Reported on 08/20/2017 01/08/17   Waynetta Pean, PA-C      Allergies    Patient has no known allergies.    Review of Systems   Review of Systems  Respiratory:  Positive for cough.   All other systems reviewed and are negative.   Physical Exam Updated Vital Signs BP 137/81 (BP Location: Left Arm)   Pulse 74   Temp 98.7 F (37.1 C) (Oral)   Resp 18   Ht 5\' 4"  (1.626 m)   Wt 52.2 kg   SpO2 96%   BMI 19.74 kg/m  Physical Exam Vitals and nursing note reviewed.  Constitutional:      General: She is not in acute distress.    Appearance: Normal appearance.  HENT:     Head: Normocephalic and atraumatic.  Eyes:     General:        Right eye: No discharge.        Left eye: No discharge.  Cardiovascular:     Comments: Regular rate and rhythm.  S1/S2 are distinct without any evidence of murmur, rubs, or gallops.  Radial pulses are 2+ bilaterally.  Dorsalis pedis pulses are 2+ bilaterally.  No evidence of pedal edema. Pulmonary:  Effort: Pulmonary effort is normal.     Breath sounds: Normal air entry. Wheezing present.  Abdominal:     General: Abdomen is flat. Bowel sounds are normal. There is no distension.     Tenderness: There is no abdominal tenderness. There is no guarding or rebound.  Musculoskeletal:        General: Normal range of motion.     Cervical back: Neck supple.  Skin:    General: Skin is warm and dry.     Findings: No rash.  Neurological:     General: No focal deficit present.     Mental Status: She is alert.  Psychiatric:        Mood and Affect: Mood normal.        Behavior: Behavior normal.     ED Results / Procedures / Treatments   Labs (all labs ordered are listed, but only abnormal results  are displayed) Labs Reviewed  RESP PANEL BY RT-PCR (RSV, FLU A&B, COVID)  RVPGX2 - Abnormal; Notable for the following components:      Result Value   Resp Syncytial Virus by PCR POSITIVE (*)    All other components within normal limits    EKG None  Radiology No results found.  Procedures Procedures    Medications Ordered in ED Medications  albuterol (VENTOLIN HFA) 108 (90 Base) MCG/ACT inhaler 2 puff (has no administration in time range)    ED Course/ Medical Decision Making/ A&P                           Medical Decision Making Alejandra Garza is a 42 y.o. female presents with symptoms suspicious for likely viral upper respiratory infection. RSV is positive from respiratory panel. Do not suspect underlying cardiopulmonary process. I considered, but think unlikely, dangerous causes of this patient's symptoms to include ACS, CHF or COPD exacerbations, pneumonia, pneumothorax. Patient is nontoxic appearing and not in need of emergent medical intervention. Patient told to self isolate at home until symptoms subside for 72 hours. I will prescribe her Allegra D and Tessalon pearls. She received an albuterol inhaler today in the ER. Strict return precautions were given. She is safe for discharge.    Risk Prescription drug management.    Final Clinical Impression(s) / ED Diagnoses Final diagnoses:  Acute cough  Sinus pressure    Rx / DC Orders ED Discharge Orders          Ordered    fexofenadine-pseudoephedrine (ALLEGRA-D) 60-120 MG 12 hr tablet  Every 12 hours        05/29/22 1440    benzonatate (TESSALON) 100 MG capsule  Every 8 hours        05/29/22 1440              Myna Bright [place], [place] 05/29/22 1444    Gareth Morgan, MD 05/30/22 1119

## 2022-05-29 NOTE — Discharge Instructions (Signed)
I prescribed you 2 medications.  Tessalon pearls which will be helpful for your cough.  Please take as prescribed.  Have also given you Allegra-D which helps with your sinus pressure and pain.  You can use saline rinses at home, drink plenty of fluids, and get plenty of rest.  You may return to the emergency room at anytime for any worsening symptoms.

## 2022-06-13 ENCOUNTER — Other Ambulatory Visit: Payer: Self-pay | Admitting: Family

## 2022-06-13 DIAGNOSIS — Z1231 Encounter for screening mammogram for malignant neoplasm of breast: Secondary | ICD-10-CM

## 2022-07-08 ENCOUNTER — Ambulatory Visit
Admission: RE | Admit: 2022-07-08 | Discharge: 2022-07-08 | Disposition: A | Discharge status: Home / Self Care | Payer: Medicaid Other | Source: Ambulatory Visit | Attending: Family | Admitting: Family

## 2022-07-08 DIAGNOSIS — Z1231 Encounter for screening mammogram for malignant neoplasm of breast: Secondary | ICD-10-CM

## 2023-07-10 ENCOUNTER — Other Ambulatory Visit: Payer: Self-pay | Admitting: Family

## 2023-07-10 ENCOUNTER — Ambulatory Visit
Admission: RE | Admit: 2023-07-10 | Discharge: 2023-07-10 | Disposition: A | Discharge status: Home / Self Care | Payer: Medicaid Other | Source: Ambulatory Visit | Attending: Family | Admitting: Family

## 2023-07-10 DIAGNOSIS — Z1231 Encounter for screening mammogram for malignant neoplasm of breast: Secondary | ICD-10-CM

## 2024-03-17 ENCOUNTER — Other Ambulatory Visit: Payer: Self-pay

## 2024-03-17 ENCOUNTER — Emergency Department (HOSPITAL_BASED_OUTPATIENT_CLINIC_OR_DEPARTMENT_OTHER)
Admission: EM | Admit: 2024-03-17 | Discharge: 2024-03-18 | Disposition: A | Attending: Emergency Medicine | Admitting: Emergency Medicine

## 2024-03-17 ENCOUNTER — Encounter (HOSPITAL_BASED_OUTPATIENT_CLINIC_OR_DEPARTMENT_OTHER): Payer: Self-pay

## 2024-03-17 DIAGNOSIS — M542 Cervicalgia: Secondary | ICD-10-CM | POA: Diagnosis present

## 2024-03-17 DIAGNOSIS — M436 Torticollis: Secondary | ICD-10-CM | POA: Diagnosis not present

## 2024-03-17 DIAGNOSIS — M62838 Other muscle spasm: Secondary | ICD-10-CM | POA: Insufficient documentation

## 2024-03-17 MED ORDER — KETOROLAC TROMETHAMINE 60 MG/2ML IM SOLN
30.0000 mg | Freq: Once | INTRAMUSCULAR | Status: AC
  Administered 2024-03-18: 30 mg via INTRAMUSCULAR
  Filled 2024-03-17: qty 2

## 2024-03-17 NOTE — Discharge Instructions (Signed)

## 2024-03-17 NOTE — ED Triage Notes (Signed)
 Pt reports neck and back pain x4 days, ibuprofen  taken for the pain with little to no relief. Denies numbness/tingling to extremities. No injury or trauma noted to neck or back.

## 2024-03-17 NOTE — ED Provider Notes (Signed)
 [facility] Provider Note  CSN: [id number] Arrival date & time: 03/17/24 2136  Chief Complaint(s) Neck Pain  HPI Alejandra Garza is a 43 y.o. female with a past medical history listed below who presents to the emergency department with right-sided neck pain ongoing for several days.  Fluctuating in intensity.  Worse with neck movement.  Patient denies any fall or trauma.  Also reports associated right shoulder pain.  No fevers or chills.  No sore throat.  No other physical complaints   Neck Pain   Past Medical History Past Medical History:  Diagnosis Date   Anemia    Patient Active Problem List   Diagnosis Date Noted   Screening breast examination 01/18/2019   Breast lump on left side at 8 o'clock position 01/18/2019   PRURITUS 11/16/2008   SYNCOPE 11/16/2008   SYMPTOM, HEADACHE 10/12/2006   TOBACCO DEPENDENCE 07/23/2006   RHINITIS, ALLERGIC 07/23/2006   Home Medication(s) Prior to Admission medications   Medication Sig Start Date End Date Taking? Authorizing Provider  benzonatate  (TESSALON ) 100 MG capsule Take 1 capsule (100 mg total) by mouth every 8 (eight) hours. 05/29/22   Theotis Peers M, PA-C  cephALEXin  (KEFLEX ) 500 MG capsule Take 1 capsule (500 mg total) by mouth 4 (four) times daily. Patient not taking: Reported on 01/18/2019 09/12/17   Steinl, Kevin, MD  ciprofloxacin  (CIPRO ) 500 MG tablet Take 1 tablet (500 mg total) by mouth 2 (two) times daily. Patient not taking: Reported on 01/18/2019 09/15/17   Tracey Bruckner, PA-C  fexofenadine -pseudoephedrine  (ALLEGRA-D) 60-120 MG 12 hr tablet Take 1 tablet by mouth every 12 (twelve) hours. 05/29/22   Theotis Peers HERO, PA-C  ibuprofen  (ADVIL ,MOTRIN ) 200 MG tablet Take 400 mg by mouth every 6 (six) hours as needed for headache, mild pain or moderate pain.    [provider]  metroNIDAZOLE  (FLAGYL ) 500 MG tablet Take 1 tablet (500 mg total) by mouth 2 (two) times  daily. Patient not taking: Reported on 08/20/2017 04/07/16   Constant, Peggy, MD  metroNIDAZOLE  (FLAGYL ) 500 MG tablet Take 1 tablet (500 mg total) by mouth 2 (two) times daily. Patient not taking: Reported on 01/18/2019 08/24/17   Constant, Peggy, MD  metroNIDAZOLE  (FLAGYL ) 500 MG tablet Take 1 tablet (500 mg total) by mouth 2 (two) times daily. Patient not taking: Reported on 01/18/2019 09/12/17   Steinl, Kevin, MD  naproxen  (NAPROSYN ) 250 MG tablet Take 1 tablet (250 mg total) by mouth 2 (two) times daily with a meal. Patient not taking: Reported on 01/18/2019 01/08/17   Dansie, William, PA-C  ondansetron  (ZOFRAN  ODT) 4 MG disintegrating tablet Take 1 tablet (4 mg total) by mouth every 8 (eight) hours as needed for nausea or vomiting. Patient not taking: Reported on 08/20/2017 01/08/17   Dansie, William, PA-C  Allergies Patient has no known allergies.  Review of Systems Review of Systems  Musculoskeletal:  Positive for neck pain.   As noted in HPI  Physical Exam Vital Signs  I have reviewed the triage vital signs BP (!) 141/76   Pulse 74   Temp 98.7 F (37.1 C) (Oral)   Ht 5' 4 (1.626 m)   Wt 65.3 kg   SpO2 100%   BMI 24.71 kg/m   Physical Exam Vitals reviewed.  Constitutional:      General: She is not in acute distress.    Appearance: She is well-developed. She is not diaphoretic.  HENT:     Head: Normocephalic and atraumatic.     Right Ear: External ear normal.     Left Ear: External ear normal.     Nose: Nose normal.     Mouth/Throat:     Pharynx: Oropharynx is clear.     Tonsils: No tonsillar exudate or tonsillar abscesses.  Eyes:     General: No scleral icterus.    Conjunctiva/sclera: Conjunctivae normal.  Neck:     Trachea: Phonation normal.   Cardiovascular:     Rate and Rhythm: Normal rate and regular rhythm.  Pulmonary:     Effort:  Pulmonary effort is normal. No respiratory distress.     Breath sounds: No stridor.  Abdominal:     General: There is no distension.  Musculoskeletal:        General: Normal range of motion.     Cervical back: Normal range of motion. Torticollis present. Muscular tenderness present. No spinous process tenderness.  Lymphadenopathy:     Cervical: No cervical adenopathy.  Neurological:     Mental Status: She is alert and oriented to person, place, and time.  Psychiatric:        Behavior: Behavior normal.     ED Results and Treatments Labs (all labs ordered are listed, but only abnormal results are displayed) Labs Reviewed - No data to display                                                                                                                       EKG  EKG Interpretation Date/Time:    Ventricular Rate:    PR Interval:    QRS Duration:    QT Interval:    QTC Calculation:   R Axis:      Text Interpretation:         Radiology No results found.  Medications Ordered in ED Medications  ketorolac  (TORADOL ) injection 30 mg (has no administration in time range)   Procedures Procedures  (including critical care time) Medical Decision Making / ED Course   Medical Decision Making   Neck pain differential diagnosis considered.  Workup below. Exam is consistent with torticollis/muscle spasm of the right cervical musculature and right trapezius muscles. No evidence of pharyngitis, PTA.  Doubt RPA.  No lymphadenopathy.  Doubt acute vascular process.  Supportive management recommended    Final Clinical Impression(s) /  ED Diagnoses Final diagnoses:  Muscle spasms of neck  Torticollis   The patient appears reasonably screened and/or stabilized for discharge and I doubt any other medical condition or other [facility] requiring further screening, evaluation, or treatment in the ED at this time. I have discussed the findings, Dx and Tx plan with the patient/family who  expressed understanding and agree(s) with the plan. Discharge instructions discussed at length. The patient/family was given strict return precautions who verbalized understanding of the instructions. No further questions at time of discharge.  Disposition: Discharge  Condition: Good  ED Discharge Orders     None        Follow Up: Primary care provider  Call  to schedule an appointment for close follow up    This chart was dictated using voice recognition software.  Despite best efforts to proofread,  errors can occur which can change the documentation meaning.    Trine Raynell Moder, MD 03/17/24 [phone]
